# Patient Record
Sex: Male | Born: 1965 | Race: White | Hispanic: No | Smoking: Current every day smoker
Health system: Southern US, Community
[De-identification: ages and names within clinical notes are randomized; demographics above are authoritative.]

## PROBLEM LIST (undated history)

## (undated) DIAGNOSIS — F419 Anxiety disorder, unspecified: Secondary | ICD-10-CM

## (undated) DIAGNOSIS — K219 Gastro-esophageal reflux disease without esophagitis: Secondary | ICD-10-CM

## (undated) DIAGNOSIS — S22029A Unspecified fracture of second thoracic vertebra, initial encounter for closed fracture: Secondary | ICD-10-CM

## (undated) HISTORY — PX: SHOULDER ARTHROSCOPY: SHX128

## (undated) HISTORY — PX: HERNIA REPAIR: SHX51

## (undated) HISTORY — PX: BACK SURGERY: SHX140

---

## 2003-12-12 ENCOUNTER — Other Ambulatory Visit: Payer: Self-pay

## 2004-03-22 ENCOUNTER — Other Ambulatory Visit: Payer: Self-pay

## 2004-05-10 ENCOUNTER — Emergency Department: Payer: Self-pay | Admitting: Emergency Medicine

## 2004-06-30 ENCOUNTER — Emergency Department: Payer: Self-pay | Admitting: Emergency Medicine

## 2006-01-25 ENCOUNTER — Ambulatory Visit: Payer: Self-pay | Admitting: Unknown Physician Specialty

## 2006-01-26 ENCOUNTER — Emergency Department: Payer: Self-pay | Admitting: Emergency Medicine

## 2006-08-16 ENCOUNTER — Other Ambulatory Visit: Payer: Self-pay

## 2006-08-16 ENCOUNTER — Emergency Department: Payer: Self-pay | Admitting: Emergency Medicine

## 2006-08-17 ENCOUNTER — Ambulatory Visit: Payer: Self-pay | Admitting: Emergency Medicine

## 2006-08-27 ENCOUNTER — Emergency Department: Payer: Self-pay | Admitting: General Practice

## 2007-01-23 ENCOUNTER — Ambulatory Visit: Payer: Self-pay | Admitting: Anesthesiology

## 2007-03-28 ENCOUNTER — Ambulatory Visit: Payer: Self-pay | Admitting: Anesthesiology

## 2007-04-25 ENCOUNTER — Other Ambulatory Visit: Payer: Self-pay

## 2007-04-25 ENCOUNTER — Emergency Department: Payer: Self-pay | Admitting: Emergency Medicine

## 2007-04-25 ENCOUNTER — Ambulatory Visit: Payer: Self-pay | Admitting: Anesthesiology

## 2007-11-18 ENCOUNTER — Emergency Department: Payer: Self-pay | Admitting: Emergency Medicine

## 2008-05-03 ENCOUNTER — Emergency Department: Payer: Self-pay | Admitting: Internal Medicine

## 2009-04-02 ENCOUNTER — Emergency Department: Payer: Self-pay | Admitting: Emergency Medicine

## 2012-04-20 ENCOUNTER — Emergency Department: Payer: Self-pay | Admitting: Emergency Medicine

## 2014-10-17 ENCOUNTER — Emergency Department: Payer: Self-pay | Admitting: Emergency Medicine

## 2014-10-24 ENCOUNTER — Emergency Department: Payer: Self-pay | Admitting: Emergency Medicine

## 2014-10-28 ENCOUNTER — Ambulatory Visit: Payer: Self-pay | Admitting: Surgery

## 2014-10-28 DIAGNOSIS — I10 Essential (primary) hypertension: Secondary | ICD-10-CM

## 2014-10-30 ENCOUNTER — Ambulatory Visit: Payer: Self-pay | Admitting: Surgery

## 2014-10-30 LAB — DRUG SCREEN, URINE
Amphetamines, Ur Screen: NEGATIVE (ref ?–1000)
BARBITURATES, UR SCREEN: NEGATIVE (ref ?–200)
Benzodiazepine, Ur Scrn: POSITIVE (ref ?–200)
CANNABINOID 50 NG, UR ~~LOC~~: POSITIVE (ref ?–50)
COCAINE METABOLITE, UR ~~LOC~~: NEGATIVE (ref ?–300)
MDMA (Ecstasy)Ur Screen: NEGATIVE (ref ?–500)
Methadone, Ur Screen: NEGATIVE (ref ?–300)
OPIATE, UR SCREEN: NEGATIVE (ref ?–300)
PHENCYCLIDINE (PCP) UR S: NEGATIVE (ref ?–25)
TRICYCLIC, UR SCREEN: POSITIVE

## 2014-11-21 ENCOUNTER — Ambulatory Visit
Admit: 2014-11-21 | Disposition: A | Discharge status: Home / Self Care | Payer: Self-pay | Attending: Surgery | Admitting: Surgery

## 2014-12-05 ENCOUNTER — Emergency Department
Admit: 2014-12-05 | Disposition: A | Discharge status: Home / Self Care | Payer: Self-pay | Admitting: Emergency Medicine

## 2014-12-05 LAB — URINALYSIS, COMPLETE
BILIRUBIN, UR: NEGATIVE
Glucose,UR: NEGATIVE mg/dL (ref 0–75)
KETONE: NEGATIVE
Leukocyte Esterase: NEGATIVE
Nitrite: NEGATIVE
PH: 6 (ref 4.5–8.0)
Protein: NEGATIVE
Specific Gravity: 1.009 (ref 1.003–1.030)

## 2014-12-05 LAB — CBC WITH DIFFERENTIAL/PLATELET
BASOS PCT: 0.5 %
Basophil #: 0.1 10*3/uL (ref 0.0–0.1)
EOS PCT: 3 %
Eosinophil #: 0.3 10*3/uL (ref 0.0–0.7)
HCT: 43.2 % (ref 40.0–52.0)
HGB: 15.1 g/dL (ref 13.0–18.0)
LYMPHS PCT: 19.2 %
Lymphocyte #: 1.9 10*3/uL (ref 1.0–3.6)
MCH: 33.5 pg (ref 26.0–34.0)
MCHC: 35 g/dL (ref 32.0–36.0)
MCV: 96 fL (ref 80–100)
MONOS PCT: 4.6 %
Monocyte #: 0.5 x10 3/mm (ref 0.2–1.0)
NEUTROS PCT: 72.7 %
Neutrophil #: 7.3 10*3/uL — ABNORMAL HIGH (ref 1.4–6.5)
Platelet: 233 10*3/uL (ref 150–440)
RBC: 4.52 10*6/uL (ref 4.40–5.90)
RDW: 13.6 % (ref 11.5–14.5)
WBC: 10.1 10*3/uL (ref 3.8–10.6)

## 2014-12-05 LAB — COMPREHENSIVE METABOLIC PANEL
ALBUMIN: 4.4 g/dL
ALT: 17 U/L
AST: 20 U/L
Alkaline Phosphatase: 89 U/L
Anion Gap: 9 (ref 7–16)
BUN: 9 mg/dL
Bilirubin,Total: 0.1 mg/dL — ABNORMAL LOW
CALCIUM: 8.6 mg/dL — AB
Chloride: 102 mmol/L
Co2: 24 mmol/L
Creatinine: 1.2 mg/dL
EGFR (African American): >60
EGFR (Non-African Amer.): >60
Glucose: 119 mg/dL — ABNORMAL HIGH
Potassium: 3.4 mmol/L — ABNORMAL LOW
Sodium: 135 mmol/L
Total Protein: 7.5 g/dL

## 2014-12-07 NOTE — Op Note (Signed)
PATIENT NAME:  Carl Guerrero, Carl Guerrero MR#:  [id number] DATE OF BIRTH:  1966-03-28  DATE OF PROCEDURE:  10/30/2014  PREOPERATIVE DIAGNOSIS: Bilateral inguinal hernias.   POSTOPERATIVE DIAGNOSIS: Bilateral inguinal hernias.   PROCEDURE: Laparoscopic preperitoneal repair of bilateral inguinal hernias.   SURGEON: Kalika Smay E. Excell Seltzerooper, MD  ANESTHESIA: General with endotracheal tube.   INDICATIONS: This is a patient with bilateral inguinal hernias. The left is very symptomatic. Preoperatively, we discussed the rationale for surgery, the options of observation, risk of bleeding, infection, recurrence, ischemic orchitis, chronic pain syndrome, and neuroma. This was all reviewed for him in the preoperative holding area. He understood and agreed to proceed.   FINDINGS: On the right was a small indirect sac and a very weak floor. On the left was a large indirect sac and a very weak and patulous floor, as well.   DESCRIPTION OF PROCEDURE: The patient was induced under general anesthesia, given IV antibiotics. Foley catheter was placed. VTE prophylaxis was in place. He was prepped and draped in sterile fashion. Marcaine was infiltrated into skin and subcutaneous tissues around the periumbilical area. Incision was made. Dissection down to the right rectus fascia was performed. It was incised, the muscle retracted laterally. The US Surgical dissecting balloon was placed followed by the US Surgical structural balloon, and the preperitoneal space was insufflated, and under direct vision 2 midline 5 mm ports were placed. Lillyrose Reitan ligament was delineated bilaterally. The cords were skeletonized of their indirect sacs and retracted cephalad. The lateral extent of dissection was determined bilaterally and the nerve was kept in view on each side and protected.   Into the preperitoneal space, starting on the left was a 4 x 6 size laterally scissored Atrium mesh which was placed in the preperitoneal space, held in with the US  Surgical tacker, avoiding the area of the nerve. The preperitoneal space on the right side was again covered with a laterally scissored Atrium mesh as well, again held in with the US Surgical tacker, avoiding the area of the nerve. The preperitoneal space was then desufflated under direct vision. All ports were removed. The fascial edges at the periumbilical site were approximated with figure-of-eight 0 Vicryls; 4-0 subcuticular Monocryl was used on all skin edges. Steri-Strips, Mastisol and sterile dressings were placed.   The patient was taken to the recovery room in stable condition to be discharged in the care of his family. He will follow up in 10 days. There was no sign of a peritoneal rent at the time of closure under direct vision.   ____________________________ Adah Salvageichard E. Excell Seltzerooper, MD rec:ST D: 10/30/2014 15:56:37 ET T: 10/31/2014 01:48:10 ET JOB#: [id number]  cc: Adah Salvageichard E. Excell Seltzerooper, MD, <Dictator> Lattie HawICHARD E Trestin Vences MD ELECTRONICALLY SIGNED 10/31/2014 20:17

## 2014-12-19 ENCOUNTER — Emergency Department
Admission: EM | Admit: 2014-12-19 | Discharge: 2014-12-19 | Disposition: A | Discharge status: Home / Self Care | Payer: Self-pay | Attending: Emergency Medicine | Admitting: Emergency Medicine

## 2014-12-19 ENCOUNTER — Other Ambulatory Visit: Payer: Self-pay

## 2014-12-19 ENCOUNTER — Encounter: Payer: Self-pay | Admitting: Emergency Medicine

## 2014-12-19 ENCOUNTER — Emergency Department: Payer: Self-pay

## 2014-12-19 DIAGNOSIS — Z91040 Latex allergy status: Secondary | ICD-10-CM | POA: Insufficient documentation

## 2014-12-19 DIAGNOSIS — Y9289 Other specified places as the place of occurrence of the external cause: Secondary | ICD-10-CM | POA: Insufficient documentation

## 2014-12-19 DIAGNOSIS — W1809XA Striking against other object with subsequent fall, initial encounter: Secondary | ICD-10-CM | POA: Insufficient documentation

## 2014-12-19 DIAGNOSIS — R1032 Left lower quadrant pain: Secondary | ICD-10-CM | POA: Insufficient documentation

## 2014-12-19 DIAGNOSIS — S46912A Strain of unspecified muscle, fascia and tendon at shoulder and upper arm level, left arm, initial encounter: Secondary | ICD-10-CM | POA: Insufficient documentation

## 2014-12-19 DIAGNOSIS — R1031 Right lower quadrant pain: Secondary | ICD-10-CM | POA: Insufficient documentation

## 2014-12-19 DIAGNOSIS — Y998 Other external cause status: Secondary | ICD-10-CM | POA: Insufficient documentation

## 2014-12-19 DIAGNOSIS — G8929 Other chronic pain: Secondary | ICD-10-CM | POA: Insufficient documentation

## 2014-12-19 DIAGNOSIS — Y9389 Activity, other specified: Secondary | ICD-10-CM | POA: Insufficient documentation

## 2014-12-19 DIAGNOSIS — Z720 Tobacco use: Secondary | ICD-10-CM | POA: Insufficient documentation

## 2014-12-19 DIAGNOSIS — M545 Low back pain, unspecified: Secondary | ICD-10-CM

## 2014-12-19 DIAGNOSIS — M5432 Sciatica, left side: Secondary | ICD-10-CM | POA: Insufficient documentation

## 2014-12-19 DIAGNOSIS — Z9889 Other specified postprocedural states: Secondary | ICD-10-CM | POA: Insufficient documentation

## 2014-12-19 HISTORY — DX: Anxiety disorder, unspecified: F41.9

## 2014-12-19 LAB — URINALYSIS COMPLETE WITH MICROSCOPIC (ARMC ONLY)
BACTERIA UA: NONE SEEN
Bilirubin Urine: NEGATIVE
Glucose, UA: NEGATIVE mg/dL
Ketones, ur: NEGATIVE mg/dL
LEUKOCYTES UA: NEGATIVE
Nitrite: NEGATIVE
PH: 6 (ref 5.0–8.0)
PROTEIN: NEGATIVE mg/dL
Specific Gravity, Urine: 1.009 (ref 1.005–1.030)

## 2014-12-19 LAB — CBC WITH DIFFERENTIAL/PLATELET
BASOS PCT: 0 %
Basophils Absolute: 0 10*3/uL (ref 0–0.1)
EOS ABS: 0.2 10*3/uL (ref 0–0.7)
Eosinophils Relative: 2 %
HCT: 44.8 % (ref 40.0–52.0)
HEMOGLOBIN: 15.4 g/dL (ref 13.0–18.0)
LYMPHS PCT: 16 %
Lymphs Abs: 1.6 10*3/uL (ref 1.0–3.6)
MCH: 33.1 pg (ref 26.0–34.0)
MCHC: 34.5 g/dL (ref 32.0–36.0)
MCV: 96.2 fL (ref 80.0–100.0)
Monocytes Absolute: 0.5 10*3/uL (ref 0.2–1.0)
Monocytes Relative: 5 %
Neutro Abs: 7.3 10*3/uL — ABNORMAL HIGH (ref 1.4–6.5)
Neutrophils Relative %: 77 %
Platelets: 195 10*3/uL (ref 150–440)
RBC: 4.66 MIL/uL (ref 4.40–5.90)
RDW: 13.5 % (ref 11.5–14.5)
WBC: 9.6 10*3/uL (ref 3.8–10.6)

## 2014-12-19 LAB — COMPREHENSIVE METABOLIC PANEL
ALBUMIN: 4.6 g/dL (ref 3.5–5.0)
ALT: 18 U/L (ref 17–63)
AST: 23 U/L (ref 15–41)
Alkaline Phosphatase: 89 U/L (ref 38–126)
Anion gap: 10 (ref 5–15)
BUN: 5 mg/dL — ABNORMAL LOW (ref 6–20)
CHLORIDE: 102 mmol/L (ref 101–111)
CO2: 23 mmol/L (ref 22–32)
CREATININE: 1.11 mg/dL (ref 0.61–1.24)
Calcium: 8.9 mg/dL (ref 8.9–10.3)
GFR calc Af Amer: >60 mL/min (ref >60)
GFR calc non Af Amer: >60 mL/min (ref >60)
Glucose, Bld: 113 mg/dL — ABNORMAL HIGH (ref 65–99)
Potassium: 3.2 mmol/L — ABNORMAL LOW (ref 3.5–5.1)
Sodium: 135 mmol/L (ref 135–145)
Total Bilirubin: 0.5 mg/dL (ref 0.3–1.2)
Total Protein: 7.6 g/dL (ref 6.5–8.1)

## 2014-12-19 LAB — LIPASE, BLOOD: Lipase: 48 U/L (ref 22–51)

## 2014-12-19 LAB — TROPONIN I

## 2014-12-19 NOTE — ED Notes (Addendum)
Pt from home. He reports he had double hernia surgery in March. He has had abdominal pain since then. He had increased pain this morning which caused him to fall hurting his shoulder. When the pain gets bad it radiates to his testicles.

## 2014-12-19 NOTE — Discharge Instructions (Signed)
As we discussed, you have multiple sources of chronic pain for which she needs to follow up with outpatient doctors.  Please return to [facility] (Dr. Excell Seltzerooper) to see a surgeon about your pain after your hernia operation.  We also recommended to see an orthopedic surgeon such as Dr. Hyacinth MeekerMiller to discuss your left shoulder pain and your chronic lower back pain with sciatica.  Other than shoulder strain today after fall, it is fortunate that you do not have any other acute injuries at this time.  Please take over-the-counter pain medication and follow up with her regular doctors.  In the emergency department, we cannot provide narcotic pain medication for chronic conditions as per the [facility] Chronic pain policy (see below).   Emergency care providers appreciate that many patients coming to us are in severe pain and we wish to address their pain in the safest, most responsible manner.  It is important to recognize, however, that the proper treatment of chronic pain differs from that of the pain of injuries and acute illnesses.  Our goal is to provider quality, safe, personalized care and we thank you for giving us the opportunity to serve you.  The use of narcotics and related agents for chronic pain syndromes may lead to additional physical and psychological problems.  Nearly as many people die from prescription narcotics each year as die from car crashes.  Additionally, this risk is increased if such prescriptions are obtained from a variety of sources.  Therefore, only your primary care physician or a pain management specialist is able to safely treat such syndromes with narcotic medications long-term.  Documentation revealing such prescriptions have been sought from multiple sources may prohibit us from providing a refill or different narcotic medication.  Your name may be checked first through the [facility] Controlled Substances Reporting System.  This database is a record of controlled substance  medication prescriptions that the patient has received.  This has been established by [facility] in an effort to eliminate the dangerous, and often life-threatening, practice of obtaining multiple prescriptions from different medical providers.  If you have a chronic pain syndrome (i.e. chronic headaches, recurrent back or neck pain, dental pain, abdominal or pelvic pain without a specific diagnosis, or neuropathic pain such as fibromyalgia) or recurrent visits for the same condition without an acute diagnosis, you may be treated with non-narcotics and other non-addictive medicines.  Allergic reactions or negative side effects that may be reported by a patient to such medications will not typically lead to the use of a narcotic analgesic or other controlled substance as an alternative.  Patients managing chronic pain with a personal physician should have provisions in place for breakthrough pain.  If you are in crisis, you should call your physician.  If your physician directs you to the emergency department, please have the doctor call and speak to our attending physician concerning your care.  When patients come to the Emergency Department (ED) with acute medical conditions in which the ED physician feels it is appropriate to prescribe narcotic or sedating pain medication, the physician will prescribe these in very limited quantities.  The amount of these medications will last only until you can see your primary care physician in his/her office.  Any patient who returns to the ED seeking refills should expect only non-narcotic pain medications.  In the event an acute medical condition exists and the emergency physician feels it is necessary that the patient be given a narcotic or sedating medication, a  responsible adult driver should be present in the room prior to the medication being given by the nurse.  Prescriptions for narcotic or sedating medications that have been lost, stolen, or expired will  NOT be refilled in the ED.  Patients who have chronic pain may receive non-narcotic prescriptions until seen by their primary care physician.  It is every patient's personal responsibility to maintain active prescriptions with his or her primary care physician or specialist.

## 2014-12-19 NOTE — ED Notes (Addendum)
Patient c/o RLQ pain since his double inguinal hernia repair in March. Patient says he has had CT scans without a definitive diagnosis. Patient also c/o "back problems" with pain shooting down both his legs (worse on left). Patient fell this AM; unsure of cause of fall; fell on his left shoulder which has had several operations previously.

## 2014-12-19 NOTE — ED Provider Notes (Signed)
[facility] Emergency Department Provider Note  ____________________________________________  Time seen: Approximately 4:02 PM  I have reviewed the triage vital signs and the nursing notes.   HISTORY  Chief Complaint Abdominal Pain; Shoulder Pain; and Sciatica    HPI Carl Guerrero is a 49 y.o. male with chronic lower abdominal pain after a hernia surgery about 2 months ago and chronic back pain with sciatica.  He has followed up both in the emergency department and in the surgery clinic and had repeat CT scans of his abdomen and pelvis which have been unremarkable.  He states that the pain hits him intermittently and is severe in his right groin.  Today it hit him suddenly and caused him to fall, landing on his left shoulder which has some chronic pain from a prior surgery.  He is in no distress at this time.  Moving and walking make it worse, nothing makes it better.   Past Medical History  Diagnosis Date  . Anxiety     There are no active problems to display for this patient.   Past Surgical History  Procedure Laterality Date  . Shoulder arthroscopy    . Hernia repair    . Back surgery      No current outpatient prescriptions on file.  Allergies Ivp dye; Versed; Phenergan; and Latex  No family history on file.  Social History History  Substance Use Topics  . Smoking status: Current Every Day Smoker -- 1.00 packs/day    Types: Cigarettes  . Smokeless tobacco: Not on file  . Alcohol Use: Yes     Comment: rarely    Review of Systems Constitutional: No fever/chills Eyes: No visual changes. ENT: No sore throat. Cardiovascular: Denies chest pain. Respiratory: Denies shortness of breath. Gastrointestinal: Lower right groin pain.  No nausea, no vomiting.  No diarrhea.  No constipation. Genitourinary: Negative for dysuria. Musculoskeletal: Chronic lower back pain radiating down both legs, primarily the left Skin: Negative for  rash. Neurological: Negative for headaches, focal weakness or numbness.  10-point ROS otherwise negative.  ____________________________________________   PHYSICAL EXAM:  VITAL SIGNS: ED Triage Vitals  Enc Vitals Group     BP 12/19/14 1350 159/90 mmHg     Pulse Rate 12/19/14 1350 112     Resp 12/19/14 1350 16     Temp 12/19/14 1350 98 F (36.7 C)     Temp Source 12/19/14 1350 Oral     SpO2 12/19/14 1350 98 %     Weight 12/19/14 1350 192 lb (87.091 kg)     Height 12/19/14 1350  (1.778 m)     Head Cir --      Peak Flow --      Pain Score 12/19/14 1352 9     Pain Loc --      Pain Edu? --      Excl. in GC? --     Constitutional: Alert and oriented. Well appearing and in no acute distress. Eyes: Conjunctivae are normal. PERRL. EOMI. Head: Atraumatic. Nose: No congestion/rhinnorhea. Mouth/Throat: Mucous membranes are moist.  Oropharynx non-erythematous. Neck: No stridor.  No cervical spine tenderness to palpation. Cardiovascular: Normal rate, regular rhythm. Grossly normal heart sounds.  Good peripheral circulation. Respiratory: Normal respiratory effort.  No retractions. Lungs CTAB. Gastrointestinal: Soft and nontender. No distention. No abdominal bruits. No CVA tenderness. Genitourinary: Normal-appearing circumcised male.  No tenderness of the testes.  No palpable mass.  No evidence of inguinal hernia on exam. Musculoskeletal: No lower extremity  tenderness nor edema.  No joint effusions.  Tenderness with range of motion of left shoulder but range of motion is not limited. Neurologic:  Normal speech and language. No gross focal neurologic deficits are appreciated. Speech is normal. No gait instability. Skin:  Skin is warm, dry and intact. No rash noted. Psychiatric: Mood and affect are normal. Speech and behavior are normal.  ____________________________________________   LABS (all labs ordered are listed, but only abnormal results are displayed)  Labs Reviewed   COMPREHENSIVE METABOLIC PANEL - Abnormal; Notable for the following:    Potassium 3.2 (*)    Glucose, Bld 113 (*)    BUN 5 (*)    All other components within normal limits  CBC WITH DIFFERENTIAL/PLATELET - Abnormal; Notable for the following:    Neutro Abs 7.3 (*)    All other components within normal limits  URINALYSIS COMPLETEWITH MICROSCOPIC ([facility])  - Abnormal; Notable for the following:    Color, Urine YELLOW (*)    APPearance CLEAR (*)    Hgb urine dipstick 1+ (*)    Squamous Epithelial / LPF 0-5 (*)    All other components within normal limits  LIPASE, BLOOD  TROPONIN I   ____________________________________________  EKG  ED ECG REPORT   Date: 12/19/2014  EKG Time: 14:03  Rate: 90  Rhythm: normal sinus rhythm with sinus arrhythmia  Axis: Normal  Intervals:none  ST&T Change: None  ____________________________________________  RADIOLOGY  Dg Shoulder Left  12/19/2014   CLINICAL DATA:  Larey SeatFell on left shoulder this morning. Previous surgery 20 years ago. Posterior and lateral pain.  EXAM: LEFT SHOULDER - 2+ VIEW  COMPARISON:  None.  FINDINGS: No evidence of fracture, dislocation or degenerative change. Previous screw anchors noted in the anterior glenoid. Normal humeral acromial distance.  IMPRESSION: No acute or traumatic finding.  Old glenoid anchors.   Electronically Signed   By: Paulina FusiMark  Shogry M.D.   On: 12/19/2014 14:37    Report copied from [facility] (but not imported into [facility] during migration): REASON FOR EXAM:    post op pelvic pain COMMENTS:     PROCEDURE: CT  - CT ABDOMEN / PELVIS  W  - Nov 21 2014 10:35AM   CLINICAL DATA:  Pelvic pain, right greater than left. 3 weeks status post repair of bilateral inguinal hernias.  EXAM: CT ABDOMEN AND PELVIS WITH CONTRAST  TECHNIQUE: Multidetector CT imaging of the abdomen and pelvis was performed using the standard protocol following bolus administration of intravenous contrast. CONTRAST:  100 mL Omnipaque 300  IV  COMPARISON:  None.  FINDINGS: Hernia mesh is identified in the pelvis without evidence of recurrent inguinal hernias. No ventral hernias are identified. No focal abscess or free fluid.  The liver, gallbladder, pancreas, spleen, adrenal glands, kidneys and bowel are within normal limits. No free air. No vascular abnormalities are identified. No incidental mass lesions or enlarged lymph nodes are seen. The bladder is unremarkable. Mild degenerative disc disease present at L4-5 and L5-S1.  IMPRESSION: Unremarkable CT of the abdomen and pelvis. No evidence of recurrent hernia is after recent hernia repair. No acute findings.  Electronically Signed   By: Irish LackGlenn  Yamagata M.D.   On: 11/21/2014 14:33   Verified By: Reola CalkinsGLENN T. YAMAGATA, M.D.,  ___________________________________________   PROCEDURES  Procedure(s) performed: None  Critical Care performed: No  ____________________________________________   INITIAL IMPRESSION / ASSESSMENT AND PLAN / ED COURSE  Pertinent labs & imaging results that were available during my care of the patient were reviewed  by me and considered in my medical decision making (see chart for details).  The patient has had multiple emergency department visits and trips to the surgery clinic in the last 6 weeks.  As per the [facility] controlled substance database, he has received 278 tablets of Percocet in that time period from at least 5 different providers (see the media tab on chart review for a scanned copy of the report).  I am concerned about chronic opioid abuse, particularly given his reassuring physical exam and radiographic imaging.  I provided the patient with follow-up information for his chronic sciatica and chronic abdominal pain and encouraged him to see his outpatient providers.  I explained that the emergency department cannot provide narcotics for his chronic pain.  He understands and states he will follow-up as an  outpatient. ____________________________________________   FINAL CLINICAL IMPRESSION(S) / ED DIAGNOSES  Final diagnoses:  Chronic bilateral lower abdominal pain  Chronic lower back pain  Chronic sciatica of left side  Left shoulder strain, initial encounter     Loleta Roseory Tzirel Leonor, MD 12/19/14 2350

## 2015-04-07 ENCOUNTER — Encounter: Payer: Self-pay | Admitting: Emergency Medicine

## 2015-04-07 ENCOUNTER — Emergency Department
Admission: EM | Admit: 2015-04-07 | Discharge: 2015-04-08 | Disposition: A | Discharge status: Home / Self Care | Payer: Self-pay | Attending: Emergency Medicine | Admitting: Emergency Medicine

## 2015-04-07 DIAGNOSIS — Z720 Tobacco use: Secondary | ICD-10-CM | POA: Insufficient documentation

## 2015-04-07 DIAGNOSIS — Y9389 Activity, other specified: Secondary | ICD-10-CM | POA: Insufficient documentation

## 2015-04-07 DIAGNOSIS — X58XXXA Exposure to other specified factors, initial encounter: Secondary | ICD-10-CM | POA: Insufficient documentation

## 2015-04-07 DIAGNOSIS — M791 Myalgia, unspecified site: Secondary | ICD-10-CM

## 2015-04-07 DIAGNOSIS — S39012A Strain of muscle, fascia and tendon of lower back, initial encounter: Secondary | ICD-10-CM | POA: Insufficient documentation

## 2015-04-07 DIAGNOSIS — Y9289 Other specified places as the place of occurrence of the external cause: Secondary | ICD-10-CM | POA: Insufficient documentation

## 2015-04-07 DIAGNOSIS — Z91040 Latex allergy status: Secondary | ICD-10-CM | POA: Insufficient documentation

## 2015-04-07 DIAGNOSIS — Y998 Other external cause status: Secondary | ICD-10-CM | POA: Insufficient documentation

## 2015-04-07 MED ORDER — ORPHENADRINE CITRATE 30 MG/ML IJ SOLN
60.0000 mg | INTRAMUSCULAR | Status: AC
  Administered 2015-04-07: 60 mg via INTRAMUSCULAR
  Filled 2015-04-07: qty 2

## 2015-04-07 MED ORDER — KETOROLAC TROMETHAMINE 60 MG/2ML IM SOLN
60.0000 mg | Freq: Once | INTRAMUSCULAR | Status: AC
  Administered 2015-04-07: 60 mg via INTRAMUSCULAR
  Filled 2015-04-07: qty 2

## 2015-04-07 NOTE — ED Notes (Signed)
Patient present to ED with c/o back pain since 7:30 this morning, states rose from his recliner and felt shooting pain in lower back. Reports pain is from neck radiating down to lower back, states "when I stand up I can't stand straight." Denies known injury to back. History of 3 back surgeries, had 4 disc removed and had emergency section for infection in his back. Reports "pain has never been this bad." States has tried ice back, goodies, warm shower with no relief. Patient denies other complaints at this time. Patient alert and oriented x 4, no increased work in breathing noted.

## 2015-04-07 NOTE — ED Provider Notes (Signed)
[facility] Emergency Department Provider Note ____________________________________________  Time seen: 2255 I have reviewed the triage vital signs and the nursing notes.  HISTORY  Chief Complaint  Back Pain  HPI Carl Guerrero is a 49 y.o. male reports to the ED with complaints of muscle spasm upon awakening this morning. He reports that onset was about 7:30 after he been up for several hours. When he has some limited increased spasm across the lower part of his back. He denies any referral to lotion any, denies any leg weakness or foot drop. He also is without any incontinence of bladder or bowel. He took a dose of Cipro. Benefits of his symptoms. He describes pain is worse when he tries to stand up straight. He is here for treatment of his symptoms without known injury, trauma, or accident.  Past Medical History  Diagnosis Date  . Anxiety    There are no active problems to display for this patient.   Past Surgical History  Procedure Laterality Date  . Shoulder arthroscopy    . Hernia repair    . Back surgery     Current Outpatient Rx  Name  Route  Sig  Dispense  Refill  . cyclobenzaprine (FLEXERIL) 5 MG tablet   Oral   Take 1 tablet (5 mg total) by mouth every 8 (eight) hours as needed for muscle spasms.   12 tablet   0   . ketorolac (TORADOL) 10 MG tablet   Oral   Take 1 tablet (10 mg total) by mouth every 8 (eight) hours.   15 tablet   0   . traMADol (ULTRAM) 50 MG tablet   Oral   Take 1 tablet (50 mg total) by mouth 2 (two) times daily.   10 tablet   0    Allergies Ivp dye; Versed; Phenergan; and Latex  No family history on file.  Social History Social History  Substance Use Topics  . Smoking status: Current Every Day Smoker -- 1.00 packs/day    Types: Cigarettes  . Smokeless tobacco: None  . Alcohol Use: Yes     Comment: rarely   Review of Systems  Constitutional: Negative for fever. Eyes: Negative for visual  changes. ENT: Negative for sore throat. Cardiovascular: Negative for chest pain. Respiratory: Negative for shortness of breath. Gastrointestinal: Negative for abdominal pain, vomiting and diarrhea. Genitourinary: Negative for dysuria. Musculoskeletal: positive for back pain. Skin: Negative for rash. Neurological: Negative for headaches, focal weakness or numbness. ____________________________________________  PHYSICAL EXAM:  VITAL SIGNS: ED Triage Vitals  Enc Vitals Group     BP 04/07/15 2133 159/89 mmHg     Pulse Rate 04/07/15 2133 87     Resp 04/07/15 2133 20     Temp 04/07/15 2133 98.1 F (36.7 C)     Temp Source 04/07/15 2133 Oral     SpO2 04/07/15 2133 99 %     Weight 04/07/15 2133 195 lb (88.451 kg)     Height 04/07/15 2133 5\' 11"  (1.803 m)     Head Cir --      Peak Flow --      Pain Score 04/07/15 2133 9     Pain Loc --      Pain Edu? --      Excl. in GC? --    Constitutional: Alert and oriented. Well appearing and in no distress. Eyes: Conjunctivae are normal. PERRL. Normal extraocular movements. ENT   Head: Normocephalic and atraumatic.   Nose: No congestion/rhinnorhea.  Mouth/Throat: Mucous membranes are moist.   Neck: Supple. No thyromegaly. Hematological/Lymphatic/Immunilogical: No cervical lymphadenopathy. Cardiovascular: Normal rate, regular rhythm.  Respiratory: Normal respiratory effort. No wheezes/rales/rhonchi. Gastrointestinal: Soft and nontender. No distention. Musculoskeletal: Normal spinal alignment without midline tenderness, deformity, or step-off. Normal lumbar flexion, but decreased extension. Negative SLR bilaterally. Normal toe/heel raise. Nontender with normal range of motion in all extremities.  Neurologic: CN II-XII grossly intact. Normal LE DTRs bilaterally. Normal gait without ataxia. Normal speech and language. No gross focal neurologic deficits are appreciated. Skin:  Skin is warm, dry and intact. No rash  noted. Psychiatric: Mood and affect are normal. Patient exhibits appropriate insight and judgment. ____________________________________________    LABS (pertinent positives/negatives) Labs Reviewed  URINALYSIS COMPLETEWITH MICROSCOPIC ([facility] ONLY) - Abnormal; Notable for the following:    Color, Urine COLORLESS (*)    APPearance CLEAR (*)    Specific Gravity, Urine 1.002 (*)    Hgb urine dipstick 1+ (*)    Bacteria, UA RARE (*)    Squamous Epithelial / LPF 0-5 (*)    All other components within normal limits  ____________________________________________  PROCEDURES  Toradol 60 mg IM Norflex 60 mg IM ____________________________________________  INITIAL IMPRESSION / ASSESSMENT AND PLAN / ED COURSE  Lumbar strain and myospasms. Exam without evidence of neuromuscular deficit. Prescriptions for Toradol, Flexeril, and Ultram provided.  Follow-up with Dr. Carlynn Purl as needeed. ____________________________________________  FINAL CLINICAL IMPRESSION(S) / ED DIAGNOSES  Final diagnoses:  Lumbar strain, initial encounter  Myalgia     Lissa Hoard, PA-C 04/08/15 0030  Myrna Blazer, MD 04/11/15 [phone]

## 2015-04-07 NOTE — ED Notes (Signed)
Pt to triage via w/c with no distress noted; pt reports hx back injury and having lower back spasms today radiating into leg

## 2015-04-08 LAB — URINALYSIS COMPLETE WITH MICROSCOPIC (ARMC ONLY)
Bilirubin Urine: NEGATIVE
GLUCOSE, UA: NEGATIVE mg/dL
Ketones, ur: NEGATIVE mg/dL
Leukocytes, UA: NEGATIVE
Nitrite: NEGATIVE
Protein, ur: NEGATIVE mg/dL
Specific Gravity, Urine: 1.002 — ABNORMAL LOW (ref 1.005–1.030)
pH: 6 (ref 5.0–8.0)

## 2015-04-08 MED ORDER — CYCLOBENZAPRINE HCL 5 MG PO TABS: 5.0000 mg | ORAL_TABLET | Freq: Three times a day (TID) | ORAL | Status: DC | PRN

## 2015-04-08 MED ORDER — KETOROLAC TROMETHAMINE 10 MG PO TABS
10.0000 mg | ORAL_TABLET | Freq: Three times a day (TID) | ORAL | Status: DC
Start: 2015-04-08 — End: 2015-11-02

## 2015-04-08 MED ORDER — TRAMADOL HCL 50 MG PO TABS: 50.0000 mg | ORAL_TABLET | Freq: Two times a day (BID) | ORAL | Status: DC

## 2015-04-08 NOTE — ED Notes (Signed)

## 2015-04-08 NOTE — Discharge Instructions (Signed)
Back Pain, Adult °Low back pain is very common. About 1 in 5 people have back pain. The cause of low back pain is rarely dangerous. The pain often gets better over time. About half of people with a sudden onset of back pain feel better in just 2 weeks. About 8 in 10 people feel better by 6 weeks.  °CAUSES °Some common causes of back pain include: °· Strain of the muscles or ligaments supporting the spine. °· Wear and tear (degeneration) of the spinal discs. °· Arthritis. °· Direct injury to the back. °DIAGNOSIS °Most of the time, the direct cause of low back pain is not known. However, back pain can be treated effectively even when the exact cause of the pain is unknown. Answering your caregiver's questions about your overall health and symptoms is one of the most accurate ways to make sure the cause of your pain is not dangerous. If your caregiver needs more information, he or she may order lab work or imaging tests (X-rays or MRIs). However, even if imaging tests show changes in your back, this usually does not require surgery. °HOME CARE INSTRUCTIONS °For many people, back pain returns. Since low back pain is rarely dangerous, it is often a condition that people can learn to manage on their own.  °· Remain active. It is stressful on the back to sit or stand in one place. Do not sit, drive, or stand in one place for more than 30 minutes at a time. Take short walks on level surfaces as soon as pain allows. Try to increase the length of time you walk each day. °· Do not stay in bed. Resting more than 1 or 2 days can delay your recovery. °· Do not avoid exercise or work. Your body is made to move. It is not dangerous to be active, even though your back may hurt. Your back will likely heal faster if you return to being active before your pain is gone. °· Pay attention to your body when you  bend and lift. Many people have less discomfort when lifting if they bend their knees, keep the load close to their bodies, and  avoid twisting. Often, the most comfortable positions are those that put less stress on your recovering back. °· Find a comfortable position to sleep. Use a firm mattress and lie on your side with your knees slightly bent. If you lie on your back, put a pillow under your knees. °· Only take over-the-counter or prescription medicines as directed by your caregiver. Over-the-counter medicines to reduce pain and inflammation are often the most helpful. Your caregiver may prescribe muscle relaxant drugs. These medicines help dull your pain so you can more quickly return to your normal activities and healthy exercise. °· Put ice on the injured area. °¨ Put ice in a plastic bag. °¨ Place a towel between your skin and the bag. °¨ Leave the ice on for 15-20 minutes, 03-04 times a day for the first 2 to 3 days. After that, ice and heat may be alternated to reduce pain and spasms. °· Ask your caregiver about trying back exercises and gentle massage. This may be of some benefit. °· Avoid feeling anxious or stressed. Stress increases muscle tension and can worsen back pain. It is important to recognize when you are anxious or stressed and learn ways to manage it. Exercise is a great option. °SEEK MEDICAL CARE IF: °· You have pain that is not relieved with rest or medicine. °· You have pain that does not improve in 1 week. °· You have new symptoms. °· You are generally not feeling well. °SEEK   IMMEDIATE MEDICAL CARE IF:   You have pain that radiates from your back into your legs.  You develop new bowel or bladder control problems.  You have unusual weakness or numbness in your arms or legs.  You develop nausea or vomiting.  You develop abdominal pain.  You feel faint. Document Released: 07/25/2005 Document Revised: 01/24/2012 Document Reviewed: 11/26/2013 [facility] Patient Information 2015 [place], [place]. This information is not intended to replace advice given to you by your health care provider. Make sure you  discuss any questions you have with your health care provider.  Take the prescription meds as directed. Follow-up with Dr. Carlynn Purl for ongoing symptoms.

## 2015-06-09 DEATH — deceased

## 2015-11-02 ENCOUNTER — Encounter: Payer: Self-pay | Admitting: Emergency Medicine

## 2015-11-02 ENCOUNTER — Emergency Department: Payer: Self-pay

## 2015-11-02 ENCOUNTER — Emergency Department
Admission: EM | Admit: 2015-11-02 | Discharge: 2015-11-02 | Disposition: A | Discharge status: Home / Self Care | Payer: Self-pay | Attending: Emergency Medicine | Admitting: Emergency Medicine

## 2015-11-02 DIAGNOSIS — Y939 Activity, unspecified: Secondary | ICD-10-CM | POA: Insufficient documentation

## 2015-11-02 DIAGNOSIS — F17210 Nicotine dependence, cigarettes, uncomplicated: Secondary | ICD-10-CM | POA: Insufficient documentation

## 2015-11-02 DIAGNOSIS — F1210 Cannabis abuse, uncomplicated: Secondary | ICD-10-CM | POA: Insufficient documentation

## 2015-11-02 DIAGNOSIS — Y929 Unspecified place or not applicable: Secondary | ICD-10-CM | POA: Insufficient documentation

## 2015-11-02 DIAGNOSIS — Y999 Unspecified external cause status: Secondary | ICD-10-CM | POA: Insufficient documentation

## 2015-11-02 DIAGNOSIS — S0083XA Contusion of other part of head, initial encounter: Secondary | ICD-10-CM | POA: Insufficient documentation

## 2015-11-02 DIAGNOSIS — F419 Anxiety disorder, unspecified: Secondary | ICD-10-CM | POA: Insufficient documentation

## 2015-11-02 DIAGNOSIS — S20211A Contusion of right front wall of thorax, initial encounter: Secondary | ICD-10-CM | POA: Insufficient documentation

## 2015-11-02 MED ORDER — IBUPROFEN 800 MG PO TABS: 800.0000 mg | ORAL_TABLET | Freq: Three times a day (TID) | ORAL | Status: AC | PRN

## 2015-11-02 MED ORDER — METHOCARBAMOL 500 MG PO TABS: 500.0000 mg | ORAL_TABLET | Freq: Four times a day (QID) | ORAL | Status: AC | PRN

## 2015-11-02 MED ORDER — TRAMADOL HCL 50 MG PO TABS: 50.0000 mg | ORAL_TABLET | Freq: Four times a day (QID) | ORAL | Status: AC | PRN
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
## 2015-11-02 NOTE — Discharge Instructions (Signed)
Chest Contusion A contusion is a deep bruise. Bruises happen when an injury causes bleeding under the skin. Signs of bruising include pain, puffiness (swelling), and discolored skin. The bruise may turn blue, purple, or yellow.  HOME CARE  Put ice on the injured area.  Put ice in a plastic bag.  Place a towel between the skin and the bag.  Leave the ice on for 15-20 minutes at a time, 03-04 times a day for the first 48 hours.  Only take medicine as told by your doctor.  Rest.  Take deep breaths (deep-breathing exercises) as told by your doctor.  Stop smoking if you smoke.  Do not lift objects over 5 pounds (2.3 kilograms) for 3 days or longer if told by your doctor. GET HELP RIGHT AWAY IF:   You have more bruising or puffiness.  You have pain that gets worse.  You have trouble breathing.  You are dizzy, weak, or pass out (faint).  You have blood in your pee (urine) or poop (stool).  You cough up or throw up (vomit) blood.  Your puffiness or pain is not helped with medicines. MAKE SURE YOU:   Understand these instructions.  Will watch your condition.  Will get help right away if you are not doing well or get worse.   This information is not intended to replace advice given to you by your health care provider. Make sure you discuss any questions you have with your health care provider.   Document Released: 01/11/2008 Document Revised: 04/18/2012 Document Reviewed: 01/16/2012 Elsevier Interactive Patient Education 2016 [employer]  Facial or Scalp Contusion A facial or scalp contusion is a deep bruise on the face or head. Injuries to the face and head generally cause a lot of swelling, especially around the eyes. Contusions are the result of an injury that caused bleeding under the skin. The contusion may turn blue, purple, or yellow. Minor injuries will give you a painless contusion, but more severe contusions may stay painful and swollen for a few weeks.  CAUSES    A facial or scalp contusion is caused by a blunt injury or trauma to the face or head area.  SIGNS AND SYMPTOMS   Swelling of the injured area.   Discoloration of the injured area.   Tenderness, soreness, or pain in the injured area.  DIAGNOSIS  The diagnosis can be made by taking a medical history and doing a physical exam. An X-ray exam, CT scan, or MRI may be needed to determine if there are any associated injuries, such as broken bones (fractures). TREATMENT  Often, the best treatment for a facial or scalp contusion is applying cold compresses to the injured area. Over-the-counter medicines may also be recommended for pain control.  HOME CARE INSTRUCTIONS   Only take over-the-counter or prescription medicines as directed by your health care provider.   Apply ice to the injured area.   Put ice in a plastic bag.   Place a towel between your skin and the bag.   Leave the ice on for 20 minutes, 2-3 times a day.  SEEK MEDICAL CARE IF:  You have bite problems.   You have pain with chewing.   You are concerned about facial defects. SEEK IMMEDIATE MEDICAL CARE IF:  You have severe pain or a headache that is not relieved by medicine.   You have unusual sleepiness, confusion, or personality changes.   You throw up (vomit).   You have a persistent nosebleed.   You  have double vision or blurred vision.   You have fluid drainage from your nose or ear.   You have difficulty walking or using your arms or legs.  MAKE SURE YOU:   Understand these instructions.  Will watch your condition.  Will get help right away if you are not doing well or get worse.   This information is not intended to replace advice given to you by your health care provider. Make sure you discuss any questions you have with your health care provider.   Document Released: 09/01/2004 Document Revised: 08/15/2014 Document Reviewed: 03/07/2013 Elsevier Interactive Patient Education 2016  [employer]  Rib Contusion A rib contusion is a deep bruise on your rib area. Contusions are the result of a blunt trauma that causes bleeding and injury to the tissues under the skin. A rib contusion may involve bruising of the ribs and of the skin and muscles in the area. The skin overlying the contusion may turn blue, purple, or yellow. Minor injuries will give you a painless contusion, but more severe contusions may stay painful and swollen for a few weeks. CAUSES  A contusion is usually caused by a blow, trauma, or direct force to an area of the body. This often occurs while playing contact sports. SYMPTOMS  Swelling and redness of the injured area.  Discoloration of the injured area.  Tenderness and soreness of the injured area.  Pain with or without movement. DIAGNOSIS  The diagnosis can be made by taking a medical history and performing a physical exam. An X-ray, CT scan, or MRI may be needed to determine if there were any associated injuries, such as broken bones (fractures) or internal injuries. TREATMENT  Often, the best treatment for a rib contusion is rest. Icing or applying cold compresses to the injured area may help reduce swelling and inflammation. Deep breathing exercises may be recommended to reduce the risk of partial lung collapse and pneumonia. Over-the-counter or prescription medicines may also be recommended for pain control. HOME CARE INSTRUCTIONS   Apply ice to the injured area:  Put ice in a plastic bag.  Place a towel between your skin and the bag.  Leave the ice on for 20 minutes, 2-3 times per day.  Take medicines only as directed by your health care provider.  Rest the injured area. Avoid strenuous activity and any activities or movements that cause pain. Be careful during activities and avoid bumping the injured area.  Perform deep-breathing exercises as directed by your health care provider.  Do not lift anything that is heavier than 5 lb (2.3 kg)  until your health care provider approves.  Do not use any tobacco products, including cigarettes, chewing tobacco, or electronic cigarettes. If you need help quitting, ask your health care provider. SEEK MEDICAL CARE IF:   You have increased bruising or swelling.  You have pain that is not controlled with treatment.  You have a fever. SEEK IMMEDIATE MEDICAL CARE IF:   You have difficulty breathing or shortness of breath.  You develop a continual cough, or you cough up thick or bloody sputum.  You feel sick to your stomach (nauseous), you throw up (vomit), or you have abdominal pain.   This information is not intended to replace advice given to you by your health care provider. Make sure you discuss any questions you have with your health care provider.   Document Released: 04/19/2001 Document Revised: 08/15/2014 Document Reviewed: 05/06/2014 Elsevier Interactive Patient Education 2016 [employer] Inc.  Jaw Contusion  A jaw contusion is a deep bruise of the jaw. Contusions are the result of an injury to muscles and tissue under the skin, which causes bleeding under the skin. The contusion may turn blue, purple, or yellow. Minor injuries will cause a painless contusion, but more severe contusions may stay painful and swollen for a few weeks. CAUSES This condition is usually caused by trauma, direct force, or a hard hit (blow) to the jaw. SYMPTOMS Symptoms of this condition include:  Jaw pain.  Jaw swelling.  Jaw bruising, redness, or discoloration.  Jaw tenderness or soreness. DIAGNOSIS This condition is diagnosed with a medical history and a physical exam. An X-ray, CT scan, or MRI may be needed to determine if there were any associated injuries, such as broken bones (fractures). TREATMENT Often, the best treatment for this condition is applying cold compresses to the injured area and eating a soft diet. Over-the-counter medicines may also be recommended for pain control. HOME  CARE INSTRUCTIONS Diet  Eat soft foods as told by your health care provider. Soft foods include baby food, gelatin, oatmeal, ice cream, applesauce, bananas, eggs, pasta, cottage cheese, soups, and yogurt.  Cut food into smaller pieces. This makes it easier to chew.  Avoid chewing gum or ice. General Instructions  If directed, apply ice to the injured area:  Put ice in a plastic bag.  Place a towel between your skin and the bag.  Leave the ice on for 20 minutes, 2-3 times per day.  Take over-the-counter and prescription medicines only as told by your health care provider.  Avoid opening your mouth widely. This includes opening your mouth to eat large pieces of food or to yawn, scream, yell, or sing.  Keep all follow-up visits as told by your health care provider. This is important. SEEK MEDICAL CARE IF:  Your pain is not controlled with medicine.  Your symptoms do not improve with treatment or they get worse.  You have new symptoms. SEEK IMMEDIATE MEDICAL CARE IF:  You have any new cracking or clicking in your jaw.  You have trouble eating or you cannot eat.   This information is not intended to replace advice given to you by your health care provider. Make sure you discuss any questions you have with your health care provider.   Document Released: 10/15/2003 Document Revised: 04/15/2015 Document Reviewed: 10/20/2014 Elsevier Interactive Patient Education [employer] Inc[employer]

## 2016-08-04 IMAGING — CT CT ABD-PELV W/ CM
1 of 4 series · 12 of 32 positions shown, 18 images · IV contrast (omnipaque)
Comparison: None.

CLINICAL DATA: Pelvic pain, right greater than left. 3 weeks status
post repair of bilateral inguinal hernias.

EXAM:
CT ABDOMEN AND PELVIS WITH CONTRAST
TECHNIQUE: Multidetector CT imaging of the abdomen and pelvis was performed
using the standard protocol following bolus administration of
intravenous contrast.
CONTRAST:  100 mL Omnipaque 300 IV

[Series 2: routine abd pel with · axial · 0.75mm/px · z∈[-1264,-849]mm · 12 of 99 slices shown, 18 images]
[im 8/99  soft-tissue]
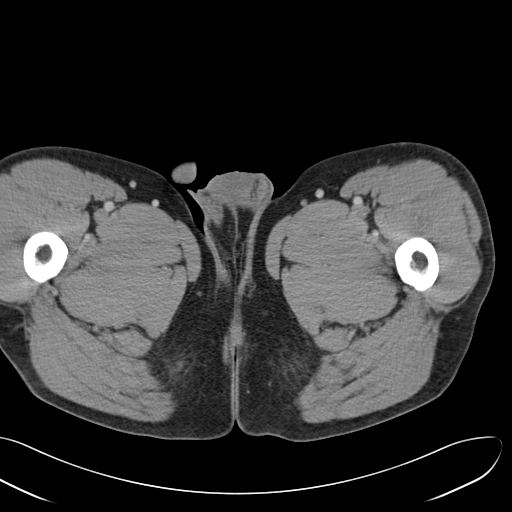
[im 8/99  bone]
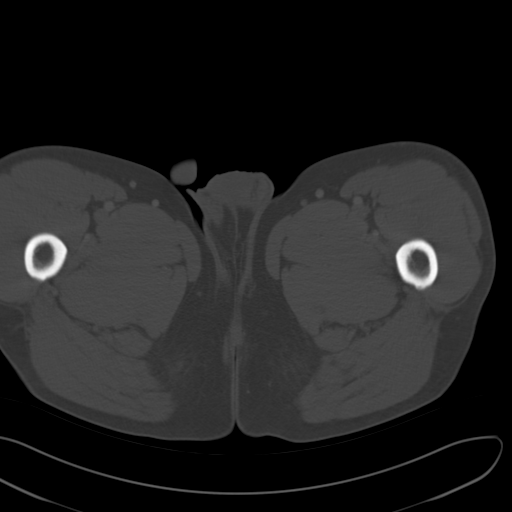
[im 16/99  soft-tissue]
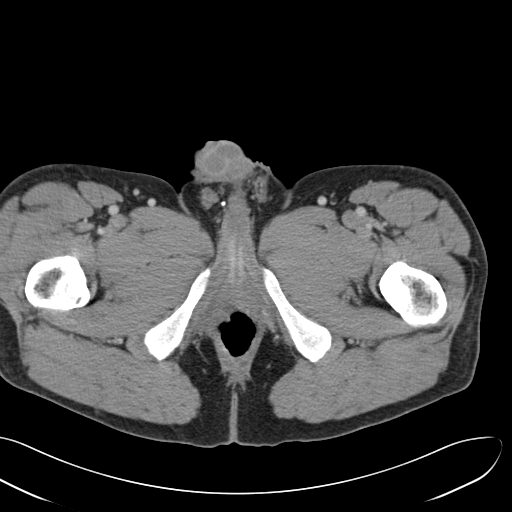
[im 23/99  soft-tissue]
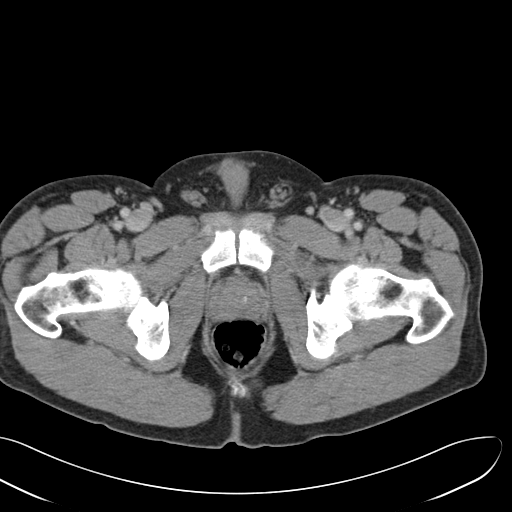
[im 31/99  soft-tissue]
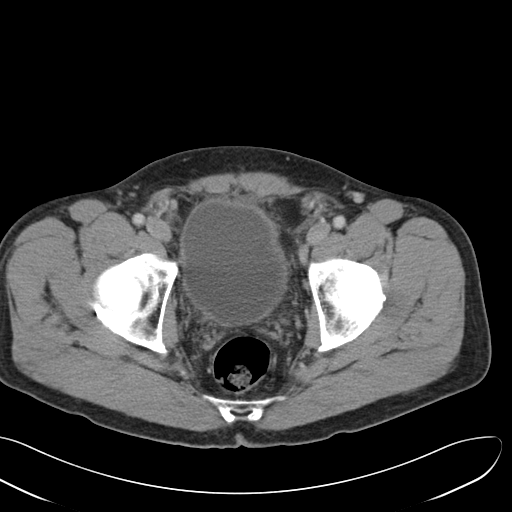
[im 38/99  soft-tissue]
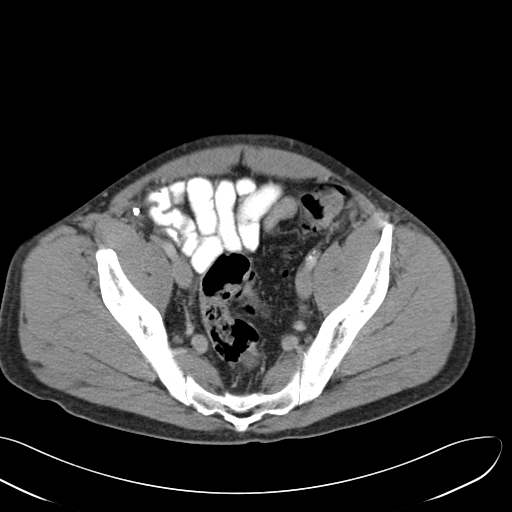
[im 46/99  soft-tissue]
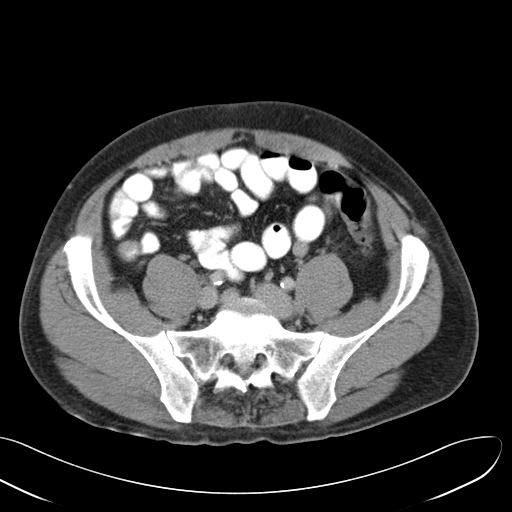
[im 53/99  soft-tissue]
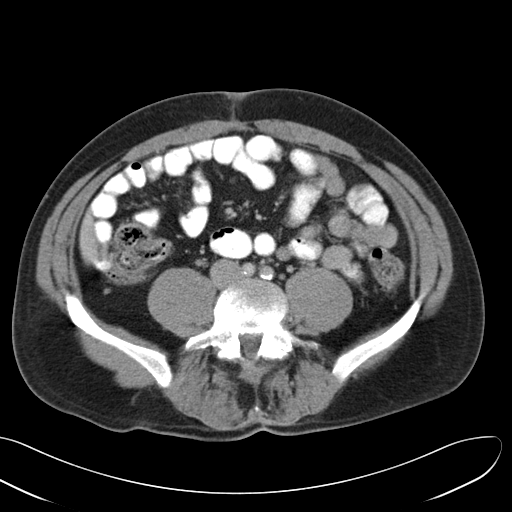
[im 61/99  soft-tissue]
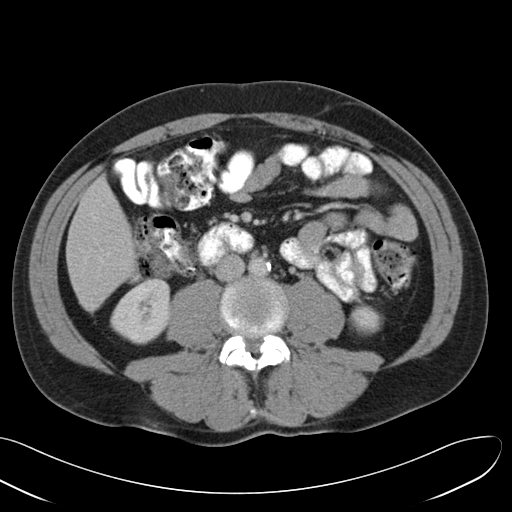
[im 68/99  soft-tissue]
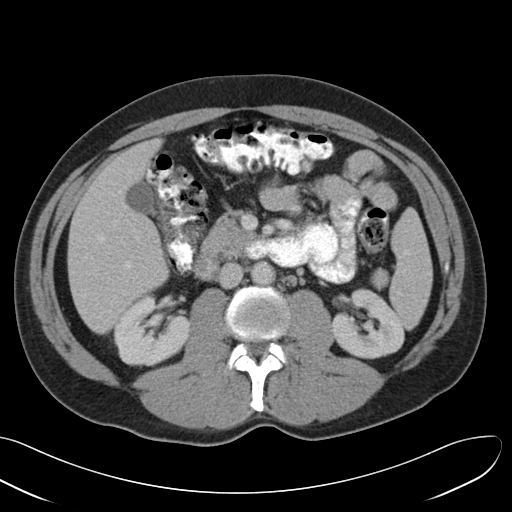
[im 68/99  lung]
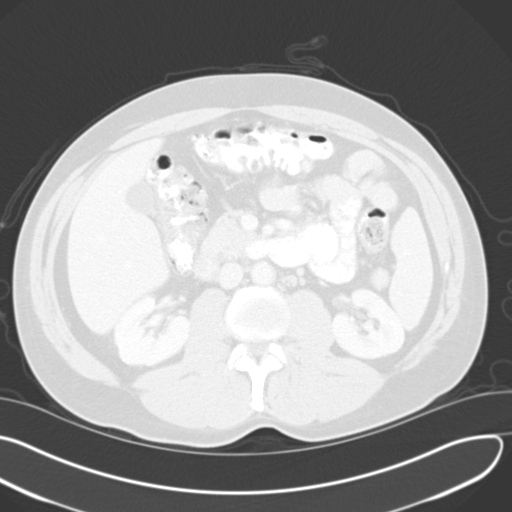
[im 68/99  bone]
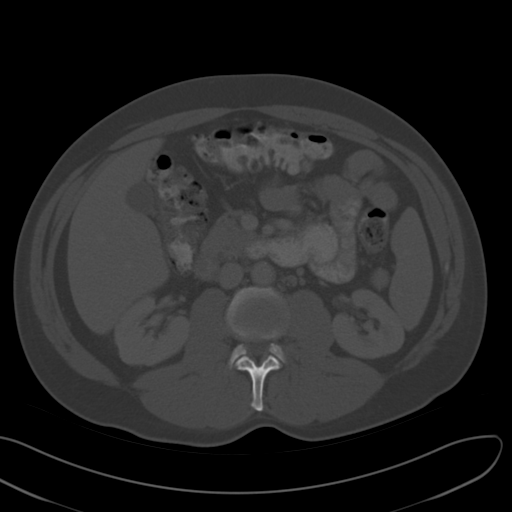
[im 76/99  soft-tissue]
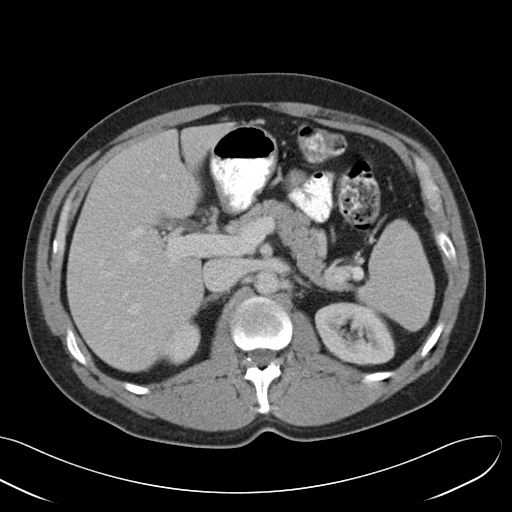
[im 76/99  lung]
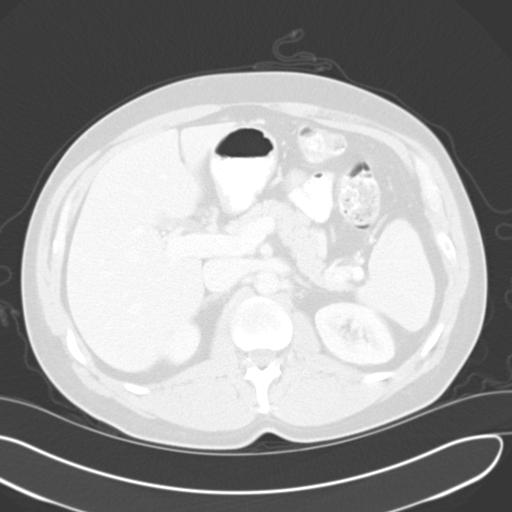
[im 83/99  soft-tissue]
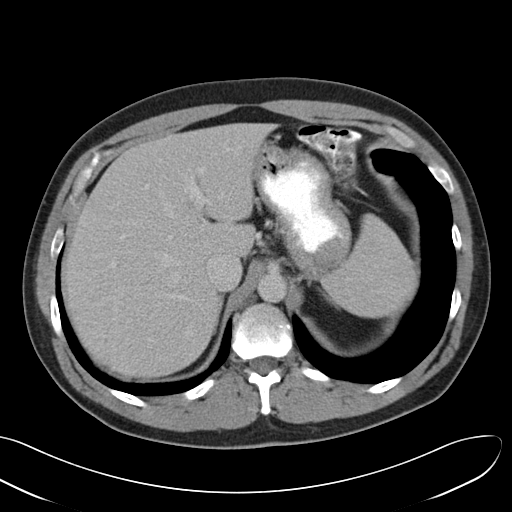
[im 83/99  lung]
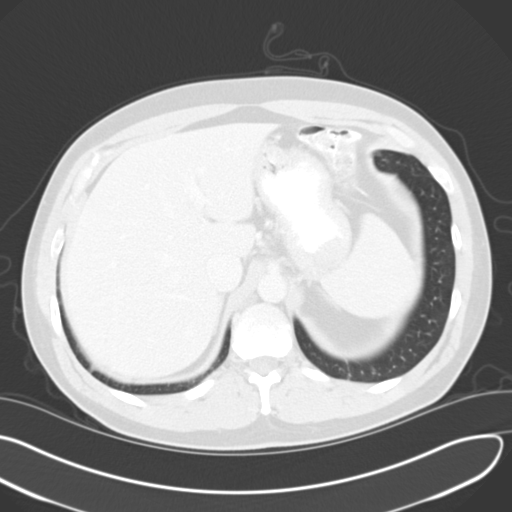
[im 91/99  soft-tissue]
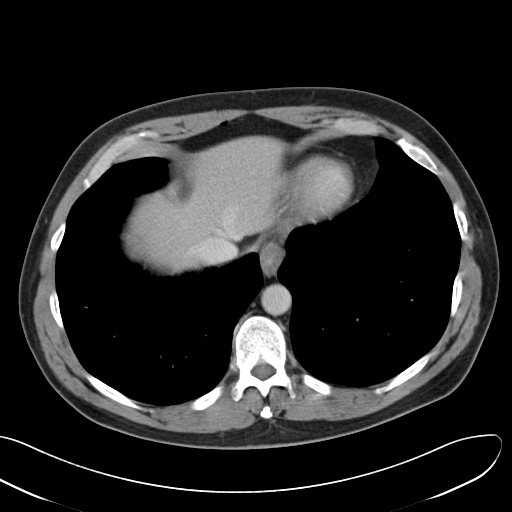
[im 91/99  lung]
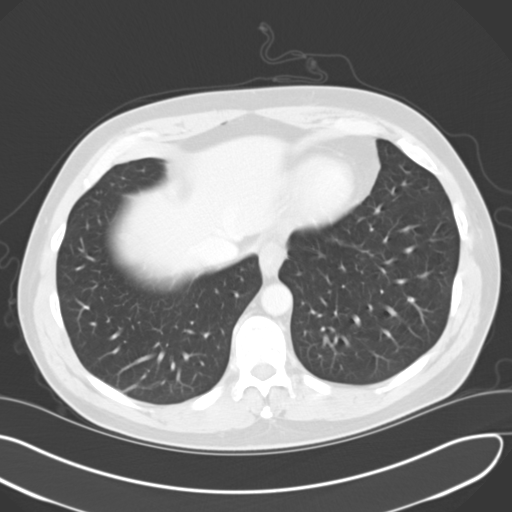

[12 of 32 positions shown; findings below may reference images not displayed]

FINDINGS: Hernia mesh is identified in the pelvis without evidence of
recurrent inguinal hernias. No ventral hernias are identified. No
focal abscess or free fluid.

The liver, gallbladder, pancreas, spleen, adrenal glands, kidneys
and bowel are within normal limits. No free air. No vascular
abnormalities are identified. No incidental mass lesions or enlarged
lymph nodes are seen. The bladder is unremarkable. Mild degenerative
disc disease present at L4-5 and L5-S1.
IMPRESSION: Unremarkable CT of the abdomen and pelvis. No evidence of recurrent
hernia is after recent hernia repair. No acute findings.

## 2016-08-21 ENCOUNTER — Emergency Department
Admission: EM | Admit: 2016-08-21 | Discharge: 2016-08-21 | Disposition: A | Discharge status: Home / Self Care | Payer: Self-pay | Attending: Emergency Medicine | Admitting: Emergency Medicine

## 2016-08-21 ENCOUNTER — Emergency Department: Payer: Self-pay

## 2016-08-21 DIAGNOSIS — R600 Localized edema: Secondary | ICD-10-CM | POA: Insufficient documentation

## 2016-08-21 DIAGNOSIS — F17210 Nicotine dependence, cigarettes, uncomplicated: Secondary | ICD-10-CM | POA: Insufficient documentation

## 2016-08-21 DIAGNOSIS — R1084 Generalized abdominal pain: Secondary | ICD-10-CM | POA: Insufficient documentation

## 2016-08-21 DIAGNOSIS — R609 Edema, unspecified: Secondary | ICD-10-CM

## 2016-08-21 HISTORY — DX: Gastro-esophageal reflux disease without esophagitis: K21.9

## 2016-08-21 LAB — BASIC METABOLIC PANEL
Anion gap: 9 (ref 5–15)
BUN: 8 mg/dL (ref 6–20)
CALCIUM: 9 mg/dL (ref 8.9–10.3)
CO2: 25 mmol/L (ref 22–32)
CREATININE: 1.2 mg/dL (ref 0.61–1.24)
Chloride: 102 mmol/L (ref 101–111)
GFR calc non Af Amer: >60 mL/min (ref >60)
Glucose, Bld: 118 mg/dL — ABNORMAL HIGH (ref 65–99)
Potassium: 3.1 mmol/L — ABNORMAL LOW (ref 3.5–5.1)
SODIUM: 136 mmol/L (ref 135–145)

## 2016-08-21 LAB — URINALYSIS, COMPLETE (UACMP) WITH MICROSCOPIC
BILIRUBIN URINE: NEGATIVE
Bacteria, UA: NONE SEEN
GLUCOSE, UA: NEGATIVE mg/dL
Ketones, ur: NEGATIVE mg/dL
Leukocytes, UA: NEGATIVE
NITRITE: NEGATIVE
Protein, ur: NEGATIVE mg/dL
SPECIFIC GRAVITY, URINE: 1.01 (ref 1.005–1.030)
Squamous Epithelial / LPF: NONE SEEN
WBC UA: NONE SEEN WBC/hpf (ref 0–5)
pH: 6 (ref 5.0–8.0)

## 2016-08-21 LAB — HEPATIC FUNCTION PANEL
ALBUMIN: 4.4 g/dL (ref 3.5–5.0)
ALT: 27 U/L (ref 17–63)
AST: 29 U/L (ref 15–41)
Alkaline Phosphatase: 68 U/L (ref 38–126)
BILIRUBIN TOTAL: 0.5 mg/dL (ref 0.3–1.2)
Total Protein: 7.3 g/dL (ref 6.5–8.1)

## 2016-08-21 LAB — CBC
HCT: 41.8 % (ref 40.0–52.0)
Hemoglobin: 14.9 g/dL (ref 13.0–18.0)
MCH: 35.8 pg — ABNORMAL HIGH (ref 26.0–34.0)
MCHC: 35.7 g/dL (ref 32.0–36.0)
MCV: 100.4 fL — AB (ref 80.0–100.0)
PLATELETS: 187 10*3/uL (ref 150–440)
RBC: 4.16 MIL/uL — AB (ref 4.40–5.90)
RDW: 14.9 % — AB (ref 11.5–14.5)
WBC: 6.3 10*3/uL (ref 3.8–10.6)

## 2016-08-21 LAB — TROPONIN I

## 2016-08-21 LAB — LIPASE, BLOOD: LIPASE: 32 U/L (ref 11–51)

## 2016-08-21 MED ORDER — GI COCKTAIL ~~LOC~~
30.0000 mL | Freq: Once | ORAL | Status: AC
  Administered 2016-08-21: 30 mL via ORAL
  Filled 2016-08-21: qty 30

## 2016-08-21 MED ORDER — HYOSCYAMINE SULFATE 0.125 MG PO TABS: 0.1250 mg | ORAL_TABLET | ORAL | 0 refills | Status: AC | PRN

## 2016-08-21 MED ORDER — POTASSIUM CHLORIDE CRYS ER 20 MEQ PO TBCR
40.0000 meq | EXTENDED_RELEASE_TABLET | Freq: Once | ORAL | Status: AC
  Administered 2016-08-21: 40 meq via ORAL
  Filled 2016-08-21: qty 2

## 2016-08-21 MED ORDER — SUCRALFATE 1 G PO TABS: 1.0000 g | ORAL_TABLET | Freq: Four times a day (QID) | ORAL | 0 refills | Status: AC

## 2016-08-21 NOTE — ED Provider Notes (Signed)
[facility] Emergency Department Provider Note  ____________________________________________   First MD Initiated Contact with Patient 08/21/16 1402     (approximate)  I have reviewed the triage vital signs and the nursing notes.   HISTORY  Chief Complaint Chest Pain and Diarrhea   HPI Carl Guerrero is a 51 y.o. male with a history of acid reflux and anxiety as well as chronic left upper quadrant pain was present in the emergency department with left upper quadrant pain over the past 3 days that is associated with nausea vomiting and diarrhea. He denies any blood in his vomitus or diarrhea. Says that he was diagnosed this past fall with a bleeding ulcer and started on a proton pump inhibitor as well as sucralfate. He is still significant ProTime pump inhibitor but is not taking any other medications. He describes the pain as aching and radiating up into his chest as well as down into his left lower abdomen. He denies any difficulty with urinating. Says that he has had about 2 episodes of diarrhea per day. Says that the pain as needed 10 right now. Does not report any shortness of breath. Says that he only drinks infrequently and denies any drug use. He was seen at [facility] for similar should this past November when he was diagnosed with an ulcer and then followed up with GI for an endoscopy as well as colonoscopy. He is also had multiple CAT scans which did not reveal acute pathology.  She also complaining of recent lower extremity swelling with the left being greater than the right.   Past Medical History:  Diagnosis Date  . Acid reflux   . Anxiety     There are no active problems to display for this patient.   Past Surgical History:  Procedure Laterality Date  . BACK SURGERY    . HERNIA REPAIR    . SHOULDER ARTHROSCOPY      Prior to Admission medications   Medication Sig Start Date End Date Taking? Authorizing Provider  ibuprofen (ADVIL,MOTRIN)  800 MG tablet Take 1 tablet (800 mg total) by mouth every 8 (eight) hours as needed. 11/02/15   Charmayne Sheer Beers, PA-C  methocarbamol (ROBAXIN) 500 MG tablet Take 1 tablet (500 mg total) by mouth every 6 (six) hours as needed for muscle spasms. 11/02/15   Charmayne Sheer Beers, PA-C  traMADol (ULTRAM) 50 MG tablet Take 1 tablet (50 mg total) by mouth every 6 (six) hours as needed. 11/02/15 11/01/16  Charmayne Sheer Beers, PA-C    Allergies Ivp dye [iodinated diagnostic agents]; Versed [midazolam]; Phenergan [promethazine]; and Latex  No family history on file.  Social History Social History  Substance Use Topics  . Smoking status: Current Every Day Smoker    Packs/day: 1.00    Types: Cigarettes  . Smokeless tobacco: Not on file  . Alcohol use Yes     Comment: rarely    Review of Systems Constitutional: No fever/chills Eyes: No visual changes. ENT: No sore throat. Cardiovascular: as above Respiratory: Denies shortness of breath. Gastrointestinal:no constipation. Genitourinary: Negative for dysuria. Musculoskeletal: Negative for back pain. Skin: Negative for rash. Neurological: Negative for headaches, focal weakness or numbness.  10-point ROS otherwise negative.  ____________________________________________   PHYSICAL EXAM:  VITAL SIGNS: ED Triage Vitals  Enc Vitals Group     BP 08/21/16 1137 (!) 154/74     Pulse Rate 08/21/16 1137 73     Resp 08/21/16 1137 18     Temp 08/21/16 1137  98.2 F (36.8 C)     Temp Source 08/21/16 1137 Oral     SpO2 08/21/16 1137 98 %     Weight 08/21/16 1135 190 lb (86.2 kg)     Height 08/21/16 1135 5\' 10"  (1.778 m)     Head Circumference --      Peak Flow --      Pain Score 08/21/16 1135 6     Pain Loc --      Pain Edu? --      Excl. in GC? --     Constitutional: Alert and oriented. Well appearing and in no acute distress. Eyes: Conjunctivae are normal. PERRL. EOMI. Head: Atraumatic. Nose: No congestion/rhinnorhea. Mouth/Throat: Mucous  membranes are moist.   Neck: No stridor.   Cardiovascular: Normal rate, regular rhythm. Grossly normal heart sounds.  Respiratory: Normal respiratory effort.  No retractions. Lungs CTAB. Gastrointestinal: Soft With mild to moderate diffuse abdominal pain. No rebound or guarding. No distention.  No CVA tenderness. Musculoskeletal: No lower extremity tenderness.  Mild bilateral lower extremity edema with the left being somewhat greater than the right.  No joint effusions. Neurologic:  Normal speech and language. No gross focal neurologic deficits are appreciated. No gait instability. Skin:  Skin is warm, dry and intact. No rash noted. Psychiatric: Mood and affect are normal. Speech and behavior are normal.  ____________________________________________   LABS (all labs ordered are listed, but only abnormal results are displayed)  Labs Reviewed  BASIC METABOLIC PANEL - Abnormal; Notable for the following:       Result Value   Potassium 3.1 (*)    Glucose, Bld 118 (*)    All other components within normal limits  CBC - Abnormal; Notable for the following:    RBC 4.16 (*)    MCV 100.4 (*)    MCH 35.8 (*)    RDW 14.9 (*)    All other components within normal limits  HEPATIC FUNCTION PANEL - Abnormal; Notable for the following:    Bilirubin, Direct <0.1 (*)    All other components within normal limits  URINALYSIS, COMPLETE (UACMP) WITH MICROSCOPIC - Abnormal; Notable for the following:    Color, Urine STRAW (*)    APPearance CLEAR (*)    Hgb urine dipstick MODERATE (*)    All other components within normal limits  TROPONIN I  LIPASE, BLOOD   ____________________________________________  EKG  ED ECG REPORT I, Arelia Longest, the attending physician, personally viewed and interpreted this ECG.   Date: 08/21/2016  EKG Time: 1137  Rate: 69  Rhythm: normal sinus rhythm  Axis: normal axis  Intervals:none  ST&T Change: No ST segment elevation or depression. No abnormal T-wave  inversion.  ____________________________________________  RADIOLOGY  DG Chest 2 View (Accession [id number]) (Order [id number])  Imaging  Date: 08/21/2016 Department: [facility] EMERGENCY DEPARTMENT Released By: Lynelle Smoke, RN (auto-released) Authorizing: Myrna Blazer, MD  Exam Information   Status Exam Begun  Exam Ended   Final [99] 08/21/2016 11:47 AM 08/21/2016 11:54 AM  PACS Images   Show images for DG Chest 2 View  Study Result   CLINICAL DATA:  Epigastric pain.  Left-sided chest pain for 2 days.  EXAM: CHEST  2 VIEW  COMPARISON:  Acute abdominal series 11/02/2015  FINDINGS: The heart size and mediastinal contours are within normal limits. Both lungs are clear. Mild emphysematous changes are present. The visualized skeletal structures are unremarkable.  IMPRESSION: 1. No acute cardiopulmonary disease. 2. Mild  emphysema.   Electronically Signed   By: Marin Robertshristopher  Mattern M.D.   On: 08/21/2016 12:29    US Venous Img Lower Bilateral (Final result)  Result time 08/21/16 17:00:59  Final result by Ulyses SouthwardMark Boles, MD (08/21/16 17:00:59)           Narrative:   CLINICAL DATA: BILATERAL lower extremity swelling LEFT greater than RIGHT for 1 day  EXAM: BILATERAL LOWER EXTREMITY VENOUS DOPPLER ULTRASOUND  TECHNIQUE: Gray-scale sonography with graded compression, as well as color Doppler and duplex ultrasound were performed to evaluate the lower extremity deep venous systems from the level of the common femoral vein and including the common femoral, femoral, profunda femoral, popliteal and calf veins including the posterior tibial, peroneal and gastrocnemius veins when visible. The superficial great saphenous vein was also interrogated. Spectral Doppler was utilized to evaluate flow at rest and with distal augmentation maneuvers in the common femoral, femoral and popliteal veins.  COMPARISON: None  FINDINGS: RIGHT LOWER  EXTREMITY  Common Femoral Vein: No evidence of thrombus. Normal compressibility, respiratory phasicity and response to augmentation.  Saphenofemoral Junction: No evidence of thrombus. Normal compressibility and flow on color Doppler imaging.  Profunda Femoral Vein: No evidence of thrombus. Normal compressibility and flow on color Doppler imaging.  Femoral Vein: No evidence of thrombus. Normal compressibility, respiratory phasicity and response to augmentation.  Popliteal Vein: No evidence of thrombus. Normal compressibility, respiratory phasicity and response to augmentation.  Calf Veins: No evidence of thrombus. Normal compressibility and flow on color Doppler imaging.  Superficial Great Saphenous Vein: No evidence of thrombus. Normal compressibility and flow on color Doppler imaging.  Venous Reflux: None.  Other Findings: None.  LEFT LOWER EXTREMITY  Common Femoral Vein: No evidence of thrombus. Normal compressibility, respiratory phasicity and response to augmentation.  Saphenofemoral Junction: No evidence of thrombus. Normal compressibility and flow on color Doppler imaging.  Profunda Femoral Vein: No evidence of thrombus. Normal compressibility and flow on color Doppler imaging.  Femoral Vein: No evidence of thrombus. Normal compressibility, respiratory phasicity and response to augmentation.  Popliteal Vein: No evidence of thrombus. Normal compressibility, respiratory phasicity and response to augmentation.  Calf Veins: No evidence of thrombus. Normal compressibility and flow on color Doppler imaging.  Superficial Great Saphenous Vein: No evidence of thrombus. Normal compressibility and flow on color Doppler imaging.  Venous Reflux: None.  Other Findings: None.  IMPRESSION: No evidence of deep venous thrombosis in either lower extremity.   Electronically Signed By: Ulyses SouthwardMark Boles M.D. On: 08/21/2016 17:00             ____________________________________________   PROCEDURES  Procedure(s) performed:   Procedures  Critical Care performed:   ____________________________________________   INITIAL IMPRESSION / ASSESSMENT AND PLAN / ED COURSE  Pertinent labs & imaging results that were available during my care of the patient were reviewed by me and considered in my medical decision making (see chart for details).  Previous records reviewed on care anywhere and it appears the patient has had multiple previous visits with similar presentations. His abdominal examination also appears consistent with previous exams.  Clinical Course   ----------------------------------------- 5:41 PM on 08/21/2016 -----------------------------------------  Patient resting comfortably at this time. If tolerated GI cocktail. No reports of any further episodes of vomiting or diarrhea in the emergency department here. Likely return of gastric symptoms. I will discharge the patient with Hycotuss vein as well as sucralfate which she been taking before and helped him greatly. No DVTs to the lower extremities. Likely peripheral  edema. No follow-up at [facility] with his known gastroenterologist. He is understanding of the diagnosis as well as laboratory results and wanted to comply with plan.    ____________________________________________   FINAL CLINICAL IMPRESSION(S) / ED DIAGNOSES  Generalized abdominal pain. Peripheral edema.    NEW MEDICATIONS STARTED DURING THIS VISIT:  New Prescriptions   No medications on file     Note:  This document was prepared using Dragon voice recognition software and may include unintentional dictation errors.    Myrna Blazer, MD 08/21/16 [phone]

## 2016-08-21 NOTE — ED Triage Notes (Signed)
Pt reports left sided CP and diarrhea X 2 days. Pt alert and oriented X4, active, cooperative, pt in NAD. RR even and unlabored, color WNL.

## 2017-08-16 ENCOUNTER — Emergency Department: Payer: Self-pay

## 2017-08-16 ENCOUNTER — Emergency Department
Admission: EM | Admit: 2017-08-16 | Discharge: 2017-08-16 | Disposition: A | Discharge status: Home / Self Care | Payer: Self-pay | Attending: Emergency Medicine | Admitting: Emergency Medicine

## 2017-08-16 ENCOUNTER — Encounter: Payer: Self-pay | Admitting: Emergency Medicine

## 2017-08-16 ENCOUNTER — Other Ambulatory Visit: Payer: Self-pay

## 2017-08-16 DIAGNOSIS — Z23 Encounter for immunization: Secondary | ICD-10-CM | POA: Insufficient documentation

## 2017-08-16 DIAGNOSIS — Z91040 Latex allergy status: Secondary | ICD-10-CM | POA: Insufficient documentation

## 2017-08-16 DIAGNOSIS — Y999 Unspecified external cause status: Secondary | ICD-10-CM | POA: Insufficient documentation

## 2017-08-16 DIAGNOSIS — Y9301 Activity, walking, marching and hiking: Secondary | ICD-10-CM | POA: Insufficient documentation

## 2017-08-16 DIAGNOSIS — F17210 Nicotine dependence, cigarettes, uncomplicated: Secondary | ICD-10-CM | POA: Insufficient documentation

## 2017-08-16 DIAGNOSIS — Y929 Unspecified place or not applicable: Secondary | ICD-10-CM | POA: Insufficient documentation

## 2017-08-16 DIAGNOSIS — W450XXA Nail entering through skin, initial encounter: Secondary | ICD-10-CM | POA: Insufficient documentation

## 2017-08-16 DIAGNOSIS — S91331A Puncture wound without foreign body, right foot, initial encounter: Secondary | ICD-10-CM | POA: Insufficient documentation

## 2017-08-16 MED ORDER — AMOXICILLIN-POT CLAVULANATE 875-125 MG PO TABS: 1.0000 | ORAL_TABLET | Freq: Two times a day (BID) | ORAL | 0 refills | Status: DC

## 2017-08-16 MED ORDER — TETANUS-DIPHTH-ACELL PERTUSSIS 5-2.5-18.5 LF-MCG/0.5 IM SUSP
0.5000 mL | Freq: Once | INTRAMUSCULAR | Status: AC
  Administered 2017-08-16: 0.5 mL via INTRAMUSCULAR
  Filled 2017-08-16: qty 0.5

## 2017-08-16 MED ORDER — TRAMADOL HCL 50 MG PO TABS: 50.0000 mg | ORAL_TABLET | Freq: Four times a day (QID) | ORAL | 0 refills | Status: AC | PRN

## 2017-08-16 MED ORDER — MELOXICAM 15 MG PO TABS: 15.0000 mg | ORAL_TABLET | Freq: Every day | ORAL | 0 refills | Status: AC

## 2017-08-16 MED ORDER — OXYCODONE-ACETAMINOPHEN 5-325 MG PO TABS
1.0000 | ORAL_TABLET | Freq: Once | ORAL | Status: AC
  Administered 2017-08-16: 1 via ORAL
  Filled 2017-08-16: qty 1

## 2017-08-16 NOTE — ED Provider Notes (Signed)
[facility] Emergency Department Provider Note  ____________________________________________  Time seen: Approximately 8:23 PM  I have reviewed the triage vital signs and the nursing notes.   HISTORY  Chief Complaint Puncture Wound    HPI Carl Guerrero is a 52 y.o. male who presents emergency department complaining of puncture wound to the plantar aspect of the right foot.  Patient reports that he was walking today when he stepped on an exposed rusty nail.  Patient reports that the nail was at least 3 inches long and he reports that at least 1-1/2 inches went into his foot.  Patient is unsure of his last tetanus shot.  He reports that there is pain to the ball of his foot, where the puncture wound occurred. patient tried cleaning at home.  Patient states that there is no significant sock or shoe material missing.  Patient is able to ambulate on foot but doing so increases pain.  No other injury or complaint.  No medications for this complaint prior to arrival.  Past Medical History:  Diagnosis Date  . Acid reflux   . Anxiety     There are no active problems to display for this patient.   Past Surgical History:  Procedure Laterality Date  . BACK SURGERY    . HERNIA REPAIR    . SHOULDER ARTHROSCOPY      Prior to Admission medications   Medication Sig Start Date End Date Taking? Authorizing Provider  amoxicillin-clavulanate (AUGMENTIN) 875-125 MG tablet Take 1 tablet by mouth 2 (two) times daily. 08/16/17   Lynford Espinoza, Delorise Royals, PA-C  hyoscyamine (LEVSIN, ANASPAZ) 0.125 MG tablet Take 1 tablet (0.125 mg total) by mouth every 4 (four) hours as needed for cramping. 08/21/16   Schaevitz, Myra Rude, MD  ibuprofen (ADVIL,MOTRIN) 800 MG tablet Take 1 tablet (800 mg total) by mouth every 8 (eight) hours as needed. 11/02/15   Beers, Charmayne Sheer, PA-C  meloxicam (MOBIC) 15 MG tablet Take 1 tablet (15 mg total) by mouth daily. 08/16/17   Shreyas Piatkowski, Delorise Royals, PA-C   methocarbamol (ROBAXIN) 500 MG tablet Take 1 tablet (500 mg total) by mouth every 6 (six) hours as needed for muscle spasms. 11/02/15   Beers, Charmayne Sheer, PA-C  sucralfate (CARAFATE) 1 g tablet Take 1 tablet (1 g total) by mouth 4 (four) times daily. 08/21/16 08/21/17  Schaevitz, Myra Rude, MD  traMADol (ULTRAM) 50 MG tablet Take 1 tablet (50 mg total) by mouth every 6 (six) hours as needed. 08/16/17   Abagale Boulos, Delorise Royals, PA-C    Allergies Ivp dye [iodinated diagnostic agents]; Versed [midazolam]; Phenergan [promethazine]; and Latex  History reviewed. No pertinent family history.  Social History Social History   Tobacco Use  . Smoking status: Current Every Day Smoker    Packs/day: 1.00    Types: Cigarettes  . Smokeless tobacco: Never Used  Substance Use Topics  . Alcohol use: Yes    Comment: rarely  . Drug use: Yes    Types: Marijuana     Review of Systems  Constitutional: No fever/chills Eyes: No visual changes.  Cardiovascular: no chest pain. Respiratory: no cough. No SOB. Gastrointestinal: No abdominal pain.  No nausea, no vomiting.   Musculoskeletal: Positive for puncture wound/foot injury to the plantar aspect of the right foot. Skin: Negative for rash, abrasions, lacerations, ecchymosis. Neurological: Negative for headaches, focal weakness or numbness. 10-point ROS otherwise negative.  ____________________________________________   PHYSICAL EXAM:  VITAL SIGNS: ED Triage Vitals  Enc Vitals Group  BP 08/16/17 1909 (!) 155/91     Pulse Rate 08/16/17 1909 (!) 101     Resp 08/16/17 1909 17     Temp 08/16/17 1909 97.8 F (36.6 C)     Temp Source 08/16/17 1909 Oral     SpO2 08/16/17 1909 98 %     Weight 08/16/17 1910 190 lb (86.2 kg)     Height --      Head Circumference --      Peak Flow --      Pain Score 08/16/17 1948 7     Pain Loc --      Pain Edu? --      Excl. in GC? --      Constitutional: Alert and oriented. Well appearing and in no acute  distress. Eyes: Conjunctivae are normal. PERRL. EOMI. Head: Atraumatic. Neck: No stridor.    Cardiovascular: Normal rate, regular rhythm. Normal S1 and S2.  Good peripheral circulation. Respiratory: Normal respiratory effort without tachypnea or retractions. Lungs CTAB. Good air entry to the bases with no decreased or absent breath sounds. Musculoskeletal: Full range of motion to all extremities. No gross deformities appreciated. Neurologic:  Normal speech and language. No gross focal neurologic deficits are appreciated.  Skin:  Skin is warm, dry and intact. No rash noted.  Single puncture wound noted to the plantar aspect of the right foot.  This underlies the distal aspect of the third metatarsal.  Patient is tender to palpation over the puncture wound but no other tenderness to palpation over the osseous structures of the right foot.  Full range of motion to the ankle and all digits right foot.  No surrounding erythema or edema.  No bleeding or drainage.  No visible foreign body. Psychiatric: Mood and affect are normal. Speech and behavior are normal. Patient exhibits appropriate insight and judgement.   ____________________________________________   LABS (all labs ordered are listed, but only abnormal results are displayed)  Labs Reviewed - No data to display ____________________________________________  EKG   ____________________________________________  RADIOLOGY Festus Barren Tateanna Bach, personally viewed and evaluated these images (plain radiographs) as part of my medical decision making, as well as reviewing the written report by the radiologist.  Dg Foot Complete Right  Result Date: 08/16/2017 CLINICAL DATA:  Patient stepped on a nail today at work. Puncture wound along the plantar aspect of the foot. EXAM: RIGHT FOOT COMPLETE - 3+ VIEW COMPARISON:  None. FINDINGS: No foreign body or definite soft tissue emphysema demonstrated. There is no evidence of acute fracture, dislocation  or bone destruction. The joint spaces appear intact. IMPRESSION: No acute osseous findings. No foreign body or soft tissue emphysema identified. Electronically Signed   By: Carey Bullocks M.D.   On: 08/16/2017 21:00    ____________________________________________    PROCEDURES  Procedure(s) performed:    Procedures    Medications  Tdap (BOOSTRIX) injection 0.5 mL (0.5 mLs Intramuscular Given 08/16/17 2038)  oxyCODONE-acetaminophen (PERCOCET/ROXICET) 5-325 MG per tablet 1 tablet (1 tablet Oral Given 08/16/17 2037)     ____________________________________________   INITIAL IMPRESSION / ASSESSMENT AND PLAN / ED COURSE  Pertinent labs & imaging results that were available during my care of the patient were reviewed by me and considered in my medical decision making (see chart for details).  Review of the [place] CSRS was performed in accordance of the [facility] prior to dispensing any controlled drugs.     Patient's diagnosis is consistent with a wound to the right foot.  X-ray reveals  no acute osseous abnormality or significant retained body.  My exam reveals no significant foreign body.  Patient will be given his tetanus booster here in the emergency department.  Patient will be placed on antibiotics prophylactically and given meloxicam and limited prescription for Ultram for pain.  Patient is to follow-up with primary care or podiatry as needed..  Patient is given ED precautions to return to the ED for any worsening or new symptoms.     ____________________________________________  FINAL CLINICAL IMPRESSION(S) / ED DIAGNOSES  Final diagnoses:  Puncture wound of right foot, initial encounter      NEW MEDICATIONS STARTED DURING THIS VISIT:  ED Discharge Orders        Ordered    amoxicillin-clavulanate (AUGMENTIN) 875-125 MG tablet  2 times daily     08/16/17 2120    meloxicam (MOBIC) 15 MG tablet  Daily     08/16/17 2120    traMADol (ULTRAM) 50 MG tablet  Every 6 hours PRN      08/16/17 2120          This chart was dictated using voice recognition software/Dragon. Despite best efforts to proofread, errors can occur which can change the meaning. Any change was purely unintentional.    Racheal PatchesCuthriell, Eidan Muellner D, PA-C 08/16/17 2127    Minna AntisPaduchowski, Kevin, MD 08/16/17 2318

## 2017-08-16 NOTE — ED Notes (Signed)
PT states that he stepped on a rusty nail and it went about a inch and half into his right foot. Happened about 1:00pm this afternoon. Pt states he is not sure when he last got his tetanus shot.

## 2017-08-16 NOTE — ED Triage Notes (Signed)
Pt reports he stepped on nail while working today. Pt has a puncture wound to the right bottom of foot. Pt unaware of last tetanus shot.

## 2018-08-02 ENCOUNTER — Emergency Department
Admission: EM | Admit: 2018-08-02 | Discharge: 2018-08-02 | Disposition: A | Discharge status: Home / Self Care | Payer: Self-pay | Attending: Emergency Medicine | Admitting: Emergency Medicine

## 2018-08-02 ENCOUNTER — Emergency Department: Payer: Self-pay

## 2018-08-02 ENCOUNTER — Other Ambulatory Visit: Payer: Self-pay

## 2018-08-02 ENCOUNTER — Encounter: Payer: Self-pay | Admitting: Emergency Medicine

## 2018-08-02 DIAGNOSIS — Z79899 Other long term (current) drug therapy: Secondary | ICD-10-CM | POA: Insufficient documentation

## 2018-08-02 DIAGNOSIS — F17210 Nicotine dependence, cigarettes, uncomplicated: Secondary | ICD-10-CM | POA: Insufficient documentation

## 2018-08-02 DIAGNOSIS — J9801 Acute bronchospasm: Secondary | ICD-10-CM | POA: Insufficient documentation

## 2018-08-02 HISTORY — DX: Unspecified fracture of second thoracic vertebra, initial encounter for closed fracture: S22.029A

## 2018-08-02 MED ORDER — IPRATROPIUM-ALBUTEROL 0.5-2.5 (3) MG/3ML IN SOLN
3.0000 mL | Freq: Once | RESPIRATORY_TRACT | Status: AC
  Administered 2018-08-02: 3 mL via RESPIRATORY_TRACT
  Filled 2018-08-02: qty 3

## 2018-08-02 MED ORDER — HYDROCOD POLST-CPM POLST ER 10-8 MG/5ML PO SUER: 5.0000 mL | Freq: Two times a day (BID) | ORAL | 0 refills | Status: AC

## 2018-08-02 MED ORDER — METHYLPREDNISOLONE 4 MG PO TBPK: ORAL_TABLET | ORAL | 0 refills | Status: AC

## 2018-08-02 NOTE — ED Notes (Signed)
First Nurse Note: Patient complaining of SHOB, pulse ox 95% on RA.  Color good.

## 2018-08-02 NOTE — ED Provider Notes (Signed)
[facility] Emergency Department Provider Note   ____________________________________________   First MD Initiated Contact with Patient 08/02/18 1046     (approximate)  I have reviewed the triage vital signs and the nursing notes.   HISTORY  Chief Complaint Cough    HPI Carl Guerrero is a 52 y.o. male patient presents with productive cough for 4 to 5 days.  Patient states shortness of breath.  Patient to cough increase with deep inspirations.  Patient states chest wall pain secondary to the cough.  Patient also has nasal congestion.  Patient denies sore throat.  Patient denies nausea, vomiting, diarrhea.  Patient rates his pain describes a 5/10.  Patient describes his pain is "aching".  No palliative measures prior to arrival.   Past Medical History:  Diagnosis Date  . Acid reflux   . Anxiety   . Fracture of T2 vertebra (HCC)     There are no active problems to display for this patient.   Past Surgical History:  Procedure Laterality Date  . BACK SURGERY    . HERNIA REPAIR    . SHOULDER ARTHROSCOPY      Prior to Admission medications   Medication Sig Start Date End Date Taking? Authorizing Provider  amoxicillin-clavulanate (AUGMENTIN) 875-125 MG tablet Take 1 tablet by mouth 2 (two) times daily. 08/16/17   Cuthriell, Delorise RoyalsJonathan D, PA-C  chlorpheniramine-HYDROcodone (TUSSIONEX PENNKINETIC ER) 10-8 MG/5ML SUER Take 5 mLs by mouth 2 (two) times daily. 08/02/18   Joni ReiningSmith, Lataria Courser K, PA-C  hyoscyamine (LEVSIN, ANASPAZ) 0.125 MG tablet Take 1 tablet (0.125 mg total) by mouth every 4 (four) hours as needed for cramping. 08/21/16   Schaevitz, Myra Rudeavid Matthew, MD  ibuprofen (ADVIL,MOTRIN) 800 MG tablet Take 1 tablet (800 mg total) by mouth every 8 (eight) hours as needed. 11/02/15   Beers, Charmayne Sheerharles M, PA-C  meloxicam (MOBIC) 15 MG tablet Take 1 tablet (15 mg total) by mouth daily. 08/16/17   Cuthriell, Delorise RoyalsJonathan D, PA-C  methocarbamol (ROBAXIN) 500 MG tablet Take 1  tablet (500 mg total) by mouth every 6 (six) hours as needed for muscle spasms. 11/02/15   Beers, Charmayne Sheerharles M, PA-C  methylPREDNISolone (MEDROL DOSEPAK) 4 MG TBPK tablet Take Tapered dose as directed 08/02/18   Joni ReiningSmith, Dary Dilauro K, PA-C  sucralfate (CARAFATE) 1 g tablet Take 1 tablet (1 g total) by mouth 4 (four) times daily. 08/21/16 08/21/17  Schaevitz, Myra Rudeavid Matthew, MD  traMADol (ULTRAM) 50 MG tablet Take 1 tablet (50 mg total) by mouth every 6 (six) hours as needed. 08/16/17   Cuthriell, Delorise RoyalsJonathan D, PA-C    Allergies Ivp dye [iodinated diagnostic agents]; Versed [midazolam]; Phenergan [promethazine]; and Latex  No family history on file.  Social History Social History   Tobacco Use  . Smoking status: Current Every Day Smoker    Packs/day: 1.00    Types: Cigarettes  . Smokeless tobacco: Never Used  Substance Use Topics  . Alcohol use: Yes    Comment: rarely  . Drug use: Yes    Types: Marijuana    Review of Systems Constitutional: No fever/chills Eyes: No visual changes. ENT: No sore throat. Cardiovascular: Denies chest pain. Respiratory: States shortness of breath.  Productive cough and chest congestion. Gastrointestinal: No abdominal pain.  No nausea, no vomiting.  No diarrhea.  No constipation. Genitourinary: Negative for dysuria. Musculoskeletal: Negative for back pain. Skin: Negative for rash. Neurological: Negative for headaches, focal weakness or numbness. Psychiatric:Anxiety. Allergic/Immunilogical: See medication list. ____________________________________________   PHYSICAL EXAM:  VITAL  SIGNS: ED Triage Vitals  Enc Vitals Group     BP 08/02/18 1028 137/67     Pulse Rate 08/02/18 1028 (!) 122     Resp 08/02/18 1028 20     Temp 08/02/18 1028 97.7 F (36.5 C)     Temp Source 08/02/18 1028 Oral     SpO2 08/02/18 1028 97 %     Weight 08/02/18 1028 218 lb (98.9 kg)     Height 08/02/18 1028 5\' 10"  (1.778 m)     Head Circumference --      Peak Flow --      Pain  Score 08/02/18 1039 5     Pain Loc --      Pain Edu? --      Excl. in GC? --    Constitutional: Alert and oriented. Well appearing and in no acute distress. Nose: Edematous nasal turbinates clear rhinorrhea.   Mouth/Throat: Mucous membranes are moist.  Oropharynx non-erythematous.  Postnasal drainage. Neck: No stridor. Hematological/Lymphatic/Immunilogical: No cervical lymphadenopathy. Cardiovascular: Normal rate, regular rhythm. Grossly normal heart sounds.  Good peripheral circulation. Respiratory: Normal respiratory effort.  No retractions. Lungs moderate wheezing and rales. Skin:  Skin is warm, dry and intact. No rash noted. Psychiatric: Mood and affect are normal. Speech and behavior are normal.  ____________________________________________   LABS (all labs ordered are listed, but only abnormal results are displayed)  Labs Reviewed - No data to display ____________________________________________  EKG   ____________________________________________  RADIOLOGY  ED MD interpretation:    Official radiology report(s): Dg Chest 2 View  Result Date: 08/02/2018 CLINICAL DATA:  Cough since 07/28/2018. EXAM: CHEST - 2 VIEW COMPARISON:  PA and lateral chest 08/21/2016. FINDINGS: The chest is hyperexpanded with bullous change in the apices. No consolidative process, pneumothorax or effusion. Heart size is normal. No acute or focal bony abnormality. Postoperative change left shoulder noted. IMPRESSION: No acute disease. Emphysema. Electronically Signed   By: Drusilla Kannerhomas  Dalessio M.D.   On: 08/02/2018 11:04    ____________________________________________   PROCEDURES  Procedure(s) performed: None  Procedures  Critical Care performed: No  ____________________________________________   INITIAL IMPRESSION / ASSESSMENT AND PLAN / ED COURSE  As part of my medical decision making, I reviewed the following data within the electronic MEDICAL RECORD NUMBER    Cough secondary to  bronchospasm.  Discussed x-ray finding with patient.  Patient given a DuoNeb treatment.  Patient given a prescription for Medrol Dosepak and Tussionex.  Patient [facility].      ____________________________________________   FINAL CLINICAL IMPRESSION(S) / ED DIAGNOSES  Final diagnoses:  Cough due to bronchospasm     ED Discharge Orders         Ordered    methylPREDNISolone (MEDROL DOSEPAK) 4 MG TBPK tablet     08/02/18 1120    chlorpheniramine-HYDROcodone (TUSSIONEX PENNKINETIC ER) 10-8 MG/5ML SUER  2 times daily     08/02/18 1120           Note:  This document was prepared using Dragon voice recognition software and may include unintentional dictation errors.    Joni ReiningSmith, Kalaya Infantino K, PA-C 08/02/18 1123    Emily FilbertWilliams, Jonathan E, MD 08/02/18 [phone]

## 2018-08-02 NOTE — ED Notes (Signed)
See triage note    States he developed cough this past weekend    Also has had some SOB  Afebrile on arrival

## 2018-08-02 NOTE — ED Triage Notes (Signed)
Says cough since Saturday.  Says short of breath since couple days ago.  Sounds congested.

## 2018-08-09 ENCOUNTER — Emergency Department
Admission: EM | Admit: 2018-08-09 | Discharge: 2018-08-09 | Disposition: A | Discharge status: Home / Self Care | Payer: Self-pay | Attending: Emergency Medicine | Admitting: Emergency Medicine

## 2018-08-09 ENCOUNTER — Emergency Department: Payer: Self-pay

## 2018-08-09 ENCOUNTER — Other Ambulatory Visit: Payer: Self-pay

## 2018-08-09 ENCOUNTER — Encounter: Payer: Self-pay | Admitting: Emergency Medicine

## 2018-08-09 DIAGNOSIS — Z91040 Latex allergy status: Secondary | ICD-10-CM | POA: Insufficient documentation

## 2018-08-09 DIAGNOSIS — F17210 Nicotine dependence, cigarettes, uncomplicated: Secondary | ICD-10-CM | POA: Insufficient documentation

## 2018-08-09 DIAGNOSIS — J40 Bronchitis, not specified as acute or chronic: Secondary | ICD-10-CM | POA: Insufficient documentation

## 2018-08-09 LAB — CBC WITH DIFFERENTIAL/PLATELET
ABS IMMATURE GRANULOCYTES: 0.36 10*3/uL — AB (ref 0.00–0.07)
Basophils Absolute: 0.1 10*3/uL (ref 0.0–0.1)
Basophils Relative: 0 %
Eosinophils Absolute: 0.1 10*3/uL (ref 0.0–0.5)
Eosinophils Relative: 1 %
HCT: 44.8 % (ref 39.0–52.0)
HEMOGLOBIN: 15.8 g/dL (ref 13.0–17.0)
Immature Granulocytes: 2 %
Lymphocytes Relative: 15 %
Lymphs Abs: 2.7 10*3/uL (ref 0.7–4.0)
MCH: 34.4 pg — ABNORMAL HIGH (ref 26.0–34.0)
MCHC: 35.3 g/dL (ref 30.0–36.0)
MCV: 97.6 fL (ref 80.0–100.0)
MONO ABS: 1.2 10*3/uL — AB (ref 0.1–1.0)
Monocytes Relative: 7 %
NEUTROS ABS: 13.6 10*3/uL — AB (ref 1.7–7.7)
Neutrophils Relative %: 75 %
Platelets: 280 10*3/uL (ref 150–400)
RBC: 4.59 MIL/uL (ref 4.22–5.81)
RDW: 12.8 % (ref 11.5–15.5)
WBC: 18 10*3/uL — ABNORMAL HIGH (ref 4.0–10.5)
nRBC: 0 % (ref 0.0–0.2)

## 2018-08-09 LAB — BASIC METABOLIC PANEL
Anion gap: 8 (ref 5–15)
BUN: 12 mg/dL (ref 6–20)
CO2: 24 mmol/L (ref 22–32)
Calcium: 8.5 mg/dL — ABNORMAL LOW (ref 8.9–10.3)
Chloride: 106 mmol/L (ref 98–111)
Creatinine, Ser: 1.4 mg/dL — ABNORMAL HIGH (ref 0.61–1.24)
GFR calc Af Amer: >60 mL/min (ref >60)
GFR calc non Af Amer: 58 mL/min — ABNORMAL LOW (ref >60)
Glucose, Bld: 81 mg/dL (ref 70–99)
POTASSIUM: 3.1 mmol/L — AB (ref 3.5–5.1)
Sodium: 138 mmol/L (ref 135–145)

## 2018-08-09 LAB — INFLUENZA PANEL BY PCR (TYPE A & B)
INFLBPCR: NEGATIVE
Influenza A By PCR: NEGATIVE

## 2018-08-09 LAB — BRAIN NATRIURETIC PEPTIDE: B NATRIURETIC PEPTIDE 5: 32 pg/mL (ref 0.0–100.0)

## 2018-08-09 LAB — TROPONIN I: Troponin I: <0.03 ng/mL (ref <0.03)

## 2018-08-09 MED ORDER — AZITHROMYCIN 500 MG PO TABS
500.0000 mg | ORAL_TABLET | Freq: Once | ORAL | Status: AC
  Administered 2018-08-09: 500 mg via ORAL
  Filled 2018-08-09: qty 1

## 2018-08-09 MED ORDER — METHYLPREDNISOLONE SODIUM SUCC 125 MG IJ SOLR
125.0000 mg | Freq: Once | INTRAMUSCULAR | Status: AC
  Administered 2018-08-09: 125 mg via INTRAVENOUS
  Filled 2018-08-09: qty 2

## 2018-08-09 MED ORDER — SODIUM CHLORIDE 0.9 % IV SOLN
1.0000 g | Freq: Once | INTRAVENOUS | Status: AC
  Administered 2018-08-09: 1 g via INTRAVENOUS
  Filled 2018-08-09: qty 10

## 2018-08-09 MED ORDER — MORPHINE SULFATE (PF) 4 MG/ML IV SOLN
4.0000 mg | Freq: Once | INTRAVENOUS | Status: AC
  Administered 2018-08-09: 4 mg via INTRAVENOUS
  Filled 2018-08-09: qty 1

## 2018-08-09 MED ORDER — ALBUTEROL SULFATE HFA 108 (90 BASE) MCG/ACT IN AERS: 2.0000 | INHALATION_SPRAY | Freq: Four times a day (QID) | RESPIRATORY_TRACT | 2 refills | Status: AC | PRN

## 2018-08-09 MED ORDER — AZITHROMYCIN 250 MG PO TABS: ORAL_TABLET | ORAL | 0 refills | Status: DC

## 2018-08-09 MED ORDER — ONDANSETRON HCL 4 MG/2ML IJ SOLN
4.0000 mg | Freq: Once | INTRAMUSCULAR | Status: AC
  Administered 2018-08-09: 4 mg via INTRAVENOUS
  Filled 2018-08-09: qty 2

## 2018-08-09 MED ORDER — ALBUTEROL SULFATE HFA 108 (90 BASE) MCG/ACT IN AERS: 2.0000 | INHALATION_SPRAY | Freq: Four times a day (QID) | RESPIRATORY_TRACT | 2 refills | Status: DC | PRN

## 2018-08-09 MED ORDER — IPRATROPIUM-ALBUTEROL 0.5-2.5 (3) MG/3ML IN SOLN
3.0000 mL | Freq: Once | RESPIRATORY_TRACT | Status: AC
  Administered 2018-08-09: 3 mL via RESPIRATORY_TRACT
  Filled 2018-08-09: qty 3

## 2018-08-09 NOTE — ED Notes (Signed)
Pt states he started feeling lightheaded after breathing treatment at [phone]

## 2018-08-09 NOTE — ED Notes (Signed)
Sent blood to lab. 

## 2018-08-09 NOTE — ED Provider Notes (Signed)
[facility] Emergency Department Provider Note       Time seen: ----------------------------------------- 9:29 AM on 08/09/2018 -----------------------------------------   I have reviewed the triage vital signs and the nursing notes.  HISTORY   Chief Complaint Cough and Chest Pain    HPI Carl Guerrero is a 53 y.o. male with a history of GERD, anxiety, T12 fracture who presents to the ED for cough and wheezing.  Patient is unsure if he is had any fever, just finished steroids yesterday.  Patient states he was sick since prior to Christmas with a respiratory illness when he was seen here.  Patient was given cough medicine and steroids but he does not feel like they have helped very much.  He denies fevers or chills at this time.  Past Medical History:  Diagnosis Date  . Acid reflux   . Anxiety   . Fracture of T2 vertebra (HCC)     There are no active problems to display for this patient.   Past Surgical History:  Procedure Laterality Date  . BACK SURGERY    . HERNIA REPAIR    . SHOULDER ARTHROSCOPY      Allergies Ivp dye [iodinated diagnostic agents]; Versed [midazolam]; Phenergan [promethazine]; and Latex  Social History Social History   Tobacco Use  . Smoking status: Current Every Day Smoker    Packs/day: 1.00    Types: Cigarettes  . Smokeless tobacco: Never Used  Substance Use Topics  . Alcohol use: Yes    Comment: rarely  . Drug use: Yes    Types: Marijuana   Review of Systems Constitutional: Negative for fever. Cardiovascular: Negative for chest pain. Respiratory: Positive for shortness of breath and cough Gastrointestinal: Negative for abdominal pain, vomiting and diarrhea. Musculoskeletal: Negative for back pain. Skin: Negative for rash. Neurological: Negative for headaches, focal weakness or numbness.  All systems negative/normal/unremarkable except as stated in the  HPI  ____________________________________________   PHYSICAL EXAM:  VITAL SIGNS: ED Triage Vitals  Enc Vitals Group     BP 08/09/18 0924 (!) 156/136     Pulse Rate 08/09/18 0924 (!) 153     Resp 08/09/18 0924 (!) 22     Temp 08/09/18 0924 97.7 F (36.5 C)     Temp Source 08/09/18 0924 Oral     SpO2 08/09/18 0924 95 %     Weight 08/09/18 0925 217 lb 13 oz (98.8 kg)     Height 08/09/18 0925 5\' 10"  (1.778 m)     Head Circumference --      Peak Flow --      Pain Score 08/09/18 0925 9     Pain Loc --      Pain Edu? --      Excl. in GC? --    Constitutional: Alert and oriented.  Mild distress Eyes: Conjunctivae are normal. Normal extraocular movements. ENT      Head: Normocephalic and atraumatic.      Nose: No congestion/rhinnorhea.      Mouth/Throat: Mucous membranes are moist.      Neck: No stridor. Cardiovascular: Rapid rate, regular rhythm. No murmurs, rubs, or gallops. Respiratory: Mild tachypnea with wheezing bilaterally Gastrointestinal: Soft and nontender. Normal bowel sounds Musculoskeletal: Nontender with normal range of motion in extremities. No lower extremity tenderness nor edema. Neurologic:  Normal speech and language. No gross focal neurologic deficits are appreciated.  Skin:  Skin is warm, dry and intact. No rash noted. Psychiatric: Mood and affect are normal. Speech and behavior  are normal.  ____________________________________________  EKG: Interpreted by me.  Sinus tachycardia with a rate of 120 bpm, right axis deviation, normal QT, normal axis  ____________________________________________  ED COURSE:  As part of my medical decision making, I reviewed the following data within the electronic MEDICAL RECORD NUMBER History obtained from family if available, nursing notes, old chart and ekg, as well as notes from prior ED visits. Patient presented for respiratory symptoms that have persisted since recent evaluation, we will assess with labs and imaging as  indicated at this time.   Procedures ____________________________________________   LABS (pertinent positives/negatives)  Labs Reviewed  CBC WITH DIFFERENTIAL/PLATELET - Abnormal; Notable for the following components:      Result Value   WBC 18.0 (*)    MCH 34.4 (*)    Neutro Abs 13.6 (*)    Monocytes Absolute 1.2 (*)    Abs Immature Granulocytes 0.36 (*)    All other components within normal limits  BASIC METABOLIC PANEL - Abnormal; Notable for the following components:   Potassium 3.1 (*)    Creatinine, Ser 1.40 (*)    Calcium 8.5 (*)    GFR calc non Af Amer 58 (*)    All other components within normal limits  BRAIN NATRIURETIC PEPTIDE  TROPONIN I  INFLUENZA PANEL BY PCR (TYPE A & B)    RADIOLOGY Images were viewed by me  Chest x-ray IMPRESSION: Mild bronchitic changes. ____________________________________________   DIFFERENTIAL DIAGNOSIS   COPD, pneumonia, influenza, CHF, bronchitis  FINAL ASSESSMENT AND PLAN  Bronchitis   Plan: The patient had presented for assistant cough and shortness of breath. Patient's labs did reveal leukocytosis which are somewhat related to recent steroid intake. Patient's imaging was negative for pneumonia.  He currently has improved heart rate and normal oxygen saturations.  He will be discharged with albuterol and complete a Z-Pak for persistent cough.  I do not think additional steroids would help at this time.   Ulice Dash, MD    Note: This note was generated in part or whole with voice recognition software. Voice recognition is usually quite accurate but there are transcription errors that can and very often do occur. I apologize for any typographical errors that were not detected and corrected.     Emily Filbert, MD 08/09/18 1116

## 2018-08-09 NOTE — ED Triage Notes (Signed)
Says sick since prior to christmas wit resp illness. Cough and wheezing.  Unsure of fever.  Sounds wheezy.  Just finished steroids yesterday.

## 2018-10-10 ENCOUNTER — Ambulatory Visit: Payer: Self-pay | Admitting: Pharmacy Technician

## 2018-10-10 DIAGNOSIS — Z79899 Other long term (current) drug therapy: Secondary | ICD-10-CM

## 2018-10-10 NOTE — Progress Notes (Signed)
Called patient and explained how [facility]'s program works.  Verbally read [facility]'s contract to patient over-the-phone.  Patient verbally acknowledged that he understood and had no questions about [facility]'s contract.  Mailing to patient to sign.  Patient approved to receive medication assistance at [facility] as long as eligibility criteria continues to be met.  Referring patient to [facility] and for charitable care at [facility].  [facility]

## 2018-11-10 ENCOUNTER — Encounter: Payer: Self-pay | Admitting: Emergency Medicine

## 2018-11-10 ENCOUNTER — Emergency Department
Admission: EM | Admit: 2018-11-10 | Discharge: 2018-11-10 | Disposition: A | Discharge status: Home / Self Care | Payer: Self-pay | Attending: Emergency Medicine | Admitting: Emergency Medicine

## 2018-11-10 ENCOUNTER — Emergency Department: Payer: Self-pay

## 2018-11-10 ENCOUNTER — Other Ambulatory Visit: Payer: Self-pay

## 2018-11-10 DIAGNOSIS — R0789 Other chest pain: Secondary | ICD-10-CM | POA: Insufficient documentation

## 2018-11-10 DIAGNOSIS — Z91040 Latex allergy status: Secondary | ICD-10-CM | POA: Insufficient documentation

## 2018-11-10 DIAGNOSIS — Z79899 Other long term (current) drug therapy: Secondary | ICD-10-CM | POA: Insufficient documentation

## 2018-11-10 DIAGNOSIS — F1210 Cannabis abuse, uncomplicated: Secondary | ICD-10-CM | POA: Insufficient documentation

## 2018-11-10 DIAGNOSIS — R0602 Shortness of breath: Secondary | ICD-10-CM

## 2018-11-10 DIAGNOSIS — F17210 Nicotine dependence, cigarettes, uncomplicated: Secondary | ICD-10-CM | POA: Insufficient documentation

## 2018-11-10 LAB — BASIC METABOLIC PANEL
Anion gap: 5 (ref 5–15)
BUN: 14 mg/dL (ref 6–20)
CO2: 21 mmol/L — ABNORMAL LOW (ref 22–32)
Calcium: 9 mg/dL (ref 8.9–10.3)
Chloride: 109 mmol/L (ref 98–111)
Creatinine, Ser: 1.61 mg/dL — ABNORMAL HIGH (ref 0.61–1.24)
GFR calc Af Amer: 57 mL/min — ABNORMAL LOW (ref >60)
GFR calc non Af Amer: 49 mL/min — ABNORMAL LOW (ref >60)
Glucose, Bld: 108 mg/dL — ABNORMAL HIGH (ref 70–99)
Potassium: 3.9 mmol/L (ref 3.5–5.1)
Sodium: 135 mmol/L (ref 135–145)

## 2018-11-10 LAB — CBC
HCT: 45.9 % (ref 39.0–52.0)
Hemoglobin: 15.9 g/dL (ref 13.0–17.0)
MCH: 34.5 pg — ABNORMAL HIGH (ref 26.0–34.0)
MCHC: 34.6 g/dL (ref 30.0–36.0)
MCV: 99.6 fL (ref 80.0–100.0)
Platelets: 217 10*3/uL (ref 150–400)
RBC: 4.61 MIL/uL (ref 4.22–5.81)
RDW: 13.1 % (ref 11.5–15.5)
WBC: 9.3 10*3/uL (ref 4.0–10.5)
nRBC: 0 % (ref 0.0–0.2)

## 2018-11-10 LAB — TROPONIN I: Troponin I: <0.03 ng/mL (ref <0.03)

## 2018-11-10 MED ORDER — SODIUM CHLORIDE 0.9% FLUSH: 3.0000 mL | Freq: Once | INTRAVENOUS | Status: DC

## 2018-11-10 MED ORDER — MORPHINE SULFATE (PF) 4 MG/ML IV SOLN
4.0000 mg | Freq: Once | INTRAVENOUS | Status: AC
  Administered 2018-11-10: 4 mg via INTRAVENOUS
  Filled 2018-11-10: qty 1

## 2018-11-10 MED ORDER — OXYCODONE-ACETAMINOPHEN 5-325 MG PO TABS: 1.0000 | ORAL_TABLET | ORAL | 0 refills | Status: AC | PRN

## 2018-11-10 MED ORDER — CYCLOBENZAPRINE HCL 10 MG PO TABS
10.0000 mg | ORAL_TABLET | Freq: Once | ORAL | Status: AC
  Administered 2018-11-10: 10 mg via ORAL
  Filled 2018-11-10: qty 1

## 2018-11-10 MED ORDER — ONDANSETRON HCL 4 MG/2ML IJ SOLN
4.0000 mg | Freq: Once | INTRAMUSCULAR | Status: AC
  Administered 2018-11-10: 4 mg via INTRAVENOUS
  Filled 2018-11-10: qty 2

## 2018-11-10 NOTE — ED Provider Notes (Signed)
[facility] Emergency Department Provider Note  Time seen: 9:29 AM  I have reviewed the triage vital signs and the nursing notes.   HISTORY  Chief Complaint Chest Pain and Back Pain   HPI Carl Guerrero is a 53 y.o. male with a past medical history of anxiety, presents to the emergency department for left-sided chest discomfort.  According to the patient yesterday he sneezed and since that time has been having intermittent left chest spasms.  Patient states he will be lying still and all of a sudden he will have a spasm.  Patient had multiple while I was in the room evaluating him.  Patient states sudden onset of severe pain on the left side which feels like it is on top of the ribs.  Spasm the last seconds and then will relieve itself.  Denies any shortness of breath nausea or diaphoresis.  No fever.  No travel.  No cough or congestion.  Patient states he is experience chest wall spasms in the past although they have not been this severe.   Past Medical History:  Diagnosis Date  . Acid reflux   . Anxiety   . Fracture of T2 vertebra (HCC)     There are no active problems to display for this patient.   Past Surgical History:  Procedure Laterality Date  . BACK SURGERY    . HERNIA REPAIR    . SHOULDER ARTHROSCOPY      Prior to Admission medications   Medication Sig Start Date End Date Taking? Authorizing Provider  albuterol (PROVENTIL HFA;VENTOLIN HFA) 108 (90 Base) MCG/ACT inhaler Inhale 2 puffs into the lungs every 6 (six) hours as needed for wheezing or shortness of breath. 08/09/18   Emily Filbert, MD  amoxicillin-clavulanate (AUGMENTIN) 875-125 MG tablet Take 1 tablet by mouth 2 (two) times daily. 08/16/17   Cuthriell, Delorise Royals, PA-C  azithromycin (ZITHROMAX Z-PAK) 250 MG tablet Take 1 tablet by mouth daily 08/09/18   Emily Filbert, MD  chlorpheniramine-HYDROcodone [facility] PENNKINETIC ER) 10-8 MG/5ML SUER Take 5 mLs by mouth 2 (two)  times daily. 08/02/18   Joni Reining, PA-C  hyoscyamine (LEVSIN, ANASPAZ) 0.125 MG tablet Take 1 tablet (0.125 mg total) by mouth every 4 (four) hours as needed for cramping. 08/21/16   Schaevitz, Myra Rude, MD  ibuprofen (ADVIL,MOTRIN) 800 MG tablet Take 1 tablet (800 mg total) by mouth every 8 (eight) hours as needed. 11/02/15   Beers, Charmayne Sheer, PA-C  meloxicam (MOBIC) 15 MG tablet Take 1 tablet (15 mg total) by mouth daily. 08/16/17   Cuthriell, Delorise Royals, PA-C  methocarbamol (ROBAXIN) 500 MG tablet Take 1 tablet (500 mg total) by mouth every 6 (six) hours as needed for muscle spasms. 11/02/15   Beers, Charmayne Sheer, PA-C  methylPREDNISolone (MEDROL DOSEPAK) 4 MG TBPK tablet Take Tapered dose as directed 08/02/18   Joni Reining, PA-C  sucralfate (CARAFATE) 1 g tablet Take 1 tablet (1 g total) by mouth 4 (four) times daily. 08/21/16 08/21/17  Schaevitz, Myra Rude, MD  traMADol (ULTRAM) 50 MG tablet Take 1 tablet (50 mg total) by mouth every 6 (six) hours as needed. 08/16/17   Cuthriell, Delorise Royals, PA-C    Allergies  Allergen Reactions  . Ivp Dye [Iodinated Diagnostic Agents] Anaphylaxis  . Versed [Midazolam] Anaphylaxis  . Phenergan [Promethazine] Hives  . Latex Rash    No family history on file.  Social History Social History   Tobacco Use  . Smoking status: Current  Every Day Smoker    Packs/day: 1.00    Types: Cigarettes  . Smokeless tobacco: Never Used  Substance Use Topics  . Alcohol use: Yes    Comment: rarely  . Drug use: Yes    Types: Marijuana    Review of Systems Constitutional: Negative for fever. Cardiovascular: Left-sided chest pain, 10/10 in severity during a spasm, 2/10 once a spasm is gone. Respiratory: Negative for shortness of breath.  Negative for cough. Gastrointestinal: Negative for abdominal pain, vomiting  Musculoskeletal: Negative for musculoskeletal complaints Skin: Negative for skin complaints  Neurological: Negative for headache All other ROS  negative  ____________________________________________   PHYSICAL EXAM:  VITAL SIGNS: ED Triage Vitals  Enc Vitals Group     BP 11/10/18 0850 (!) 165/139     Pulse Rate 11/10/18 0850 (!) 133     Resp 11/10/18 0850 20     Temp 11/10/18 0850 97.8 F (36.6 C)     Temp Source 11/10/18 0850 Oral     SpO2 11/10/18 0850 98 %     Weight 11/10/18 0847 220 lb (99.8 kg)     Height 11/10/18 0847 5' 10.5" (1.791 m)     Head Circumference --      Peak Flow --      Pain Score 11/10/18 0847 7     Pain Loc --      Pain Edu? --      Excl. in GC? --    Constitutional: Alert and oriented. Well appearing and in no distress. Eyes: Normal exam ENT   Head: Normocephalic and atraumatic.   Mouth/Throat: Mucous membranes are moist. Cardiovascular: Normal rate, regular rhythm. No murmur Respiratory: Normal respiratory effort without tachypnea nor retractions. Breath sounds are clear  Gastrointestinal: Soft and nontender. No distention.  Musculoskeletal: Nontender with normal range of motion in all extremities. No lower extremity tenderness or edema. Neurologic:  Normal speech and language. No gross focal neurologic deficits  Skin:  Skin is warm, dry and intact.  Psychiatric: Mood and affect are normal.  ____________________________________________    EKG  EKG viewed and interpreted by myself shows sinus tachycardia 128 bpm with a narrow QRS, normal axis, normal intervals, nonspecific but no concerning ST changes.  ____________________________________________    RADIOLOGY  Chest x-ray negative  ____________________________________________   INITIAL IMPRESSION / ASSESSMENT AND PLAN / ED COURSE  Pertinent labs & imaging results that were available during my care of the patient were reviewed by me and considered in my medical decision making (see chart for details).  Patient presents to the emergency department for left-sided chest pain/spasm.  States the pain is occurring every  few minutes and lasting several seconds before spontaneously resolving.  States he has had similar spasm in the past but has not been this severe.  Denies any shortness of breath nausea or diaphoresis at any point.  Patient denies any fever cough congestion or recent travel.  Differential would include chest wall pain, spasm, ACS, pneumonia, pneumothorax.  We will check labs including cardiac enzymes.  EKG is reassuring.  Chest x-ray pending.  We will treat with morphine and Flexeril while awaiting results.  Patient's work-up is normal.  Troponin is negative.  Patient states the pain medicine has helped somewhat but he continues to have spasms intermittently every so often.  We will discharge with a short course of pain medication.  I discussed my normal chest pain return precautions.  Patient agreeable to plan of care.  Carl Guerrero was evaluated in  Emergency Department on 11/10/2018 for the symptoms described in the history of present illness. He was evaluated in the context of the global COVID-19 pandemic, which necessitated consideration that the patient might be at risk for infection with the SARS-CoV-2 virus that causes COVID-19. Institutional protocols and algorithms that pertain to the evaluation of patients at risk for COVID-19 are in a state of rapid change based on information released by regulatory bodies including the CDC and federal and state organizations. These policies and algorithms were followed during the patient's care in the ED.   ____________________________________________   FINAL CLINICAL IMPRESSION(S) / ED DIAGNOSES  Chest wall pain   Minna Antis, MD 11/10/18 1147

## 2018-11-10 NOTE — ED Notes (Signed)
ED Provider at bedside. 

## 2018-11-10 NOTE — ED Triage Notes (Signed)
States began back pain after sneezing yesterday, chest pain began today.

## 2020-08-17 ENCOUNTER — Telehealth: Payer: Self-pay | Admitting: Pharmacy Technician

## 2020-08-17 NOTE — Telephone Encounter (Signed)
Patient failed to provide 2021 proof of income.  No additional medication assistance will be provided by [facility] without the required proof of income documentation.  Patient notified by letter.  Carl Guerrero J. Carl Guerrero Care Manager [facility]   [address] [place], [place]  [address]     This is to inform you that you are no longer eligible to receive medication assistance at [facility].  The reason(s) are:    _____Your total gross monthly household income exceeds 250% of the Federal Poverty Level.   _____Tangible assets (savings, checking, stocks/bonds, pension, retirement, etc.) exceeds our limit  _____You are eligible to receive benefits from Medicaid, [facility] or HIV Medication              Assistance Program _____You are eligible to receive benefits from a Medicare Part "D" plan _____You have prescription insurance  _____You are not an [place] resident __X__Failure to provide all requested proof of income information for 2021.    Medication assistance will resume once all requested financial information has been returned to our clinic.  If you have questions, please contact our clinic at [phone].    Thank you,  [facility] 

## 2024-05-06 ENCOUNTER — Other Ambulatory Visit: Payer: Self-pay

## 2024-05-06 ENCOUNTER — Emergency Department

## 2024-05-06 ENCOUNTER — Emergency Department
Admission: EM | Admit: 2024-05-06 | Discharge: 2024-05-06 | Disposition: A | Discharge status: Home / Self Care | Attending: Emergency Medicine | Admitting: Emergency Medicine

## 2024-05-06 ENCOUNTER — Encounter: Payer: Self-pay | Admitting: Emergency Medicine

## 2024-05-06 DIAGNOSIS — L03115 Cellulitis of right lower limb: Secondary | ICD-10-CM | POA: Diagnosis not present

## 2024-05-06 DIAGNOSIS — G8929 Other chronic pain: Secondary | ICD-10-CM

## 2024-05-06 DIAGNOSIS — L03818 Cellulitis of other sites: Secondary | ICD-10-CM

## 2024-05-06 DIAGNOSIS — M5441 Lumbago with sciatica, right side: Secondary | ICD-10-CM | POA: Diagnosis not present

## 2024-05-06 DIAGNOSIS — R000 Tachycardia, unspecified: Secondary | ICD-10-CM | POA: Diagnosis not present

## 2024-05-06 DIAGNOSIS — M79671 Pain in right foot: Secondary | ICD-10-CM | POA: Diagnosis not present

## 2024-05-06 DIAGNOSIS — M545 Low back pain, unspecified: Secondary | ICD-10-CM | POA: Diagnosis present

## 2024-05-06 DIAGNOSIS — M5440 Lumbago with sciatica, unspecified side: Secondary | ICD-10-CM | POA: Diagnosis not present

## 2024-05-06 LAB — CBC
HCT: 41 % (ref 39.0–52.0)
Hemoglobin: 14.5 g/dL (ref 13.0–17.0)
MCH: 33.6 pg (ref 26.0–34.0)
MCHC: 35.4 g/dL (ref 30.0–36.0)
MCV: 95.1 fL (ref 80.0–100.0)
Platelets: 196 K/uL (ref 150–400)
RBC: 4.31 MIL/uL (ref 4.22–5.81)
RDW: 12.3 % (ref 11.5–15.5)
WBC: 13.7 K/uL — ABNORMAL HIGH (ref 4.0–10.5)
nRBC: 0 % (ref 0.0–0.2)

## 2024-05-06 LAB — COMPREHENSIVE METABOLIC PANEL WITH GFR
ALT: 10 U/L (ref 0–44)
AST: 13 U/L — ABNORMAL LOW (ref 15–41)
Albumin: 3.9 g/dL (ref 3.5–5.0)
Alkaline Phosphatase: 81 U/L (ref 38–126)
Anion gap: 11 (ref 5–15)
BUN: 9 mg/dL (ref 6–20)
CO2: 29 mmol/L (ref 22–32)
Calcium: 9.1 mg/dL (ref 8.9–10.3)
Chloride: 98 mmol/L (ref 98–111)
Creatinine, Ser: 1.2 mg/dL (ref 0.61–1.24)
GFR, Estimated: >60 mL/min (ref >60)
Glucose, Bld: 107 mg/dL — ABNORMAL HIGH (ref 70–99)
Potassium: 3.1 mmol/L — ABNORMAL LOW (ref 3.5–5.1)
Sodium: 138 mmol/L (ref 135–145)
Total Bilirubin: 0.9 mg/dL (ref 0.0–1.2)
Total Protein: 7.3 g/dL (ref 6.5–8.1)

## 2024-05-06 LAB — LACTIC ACID, PLASMA: Lactic Acid, Venous: 1.3 mmol/L (ref 0.5–1.9)

## 2024-05-06 MED ORDER — ONDANSETRON HCL 4 MG/2ML IJ SOLN
4.0000 mg | Freq: Once | INTRAMUSCULAR | Status: AC
Start: 2024-05-06 — End: 2024-05-06
  Administered 2024-05-06: 4 mg via INTRAVENOUS
  Filled 2024-05-06: qty 2

## 2024-05-06 MED ORDER — POTASSIUM CHLORIDE CRYS ER 20 MEQ PO TBCR
20.0000 meq | EXTENDED_RELEASE_TABLET | Freq: Once | ORAL | Status: AC
  Administered 2024-05-06: 20 meq via ORAL
  Filled 2024-05-06: qty 1

## 2024-05-06 MED ORDER — CYCLOBENZAPRINE HCL 10 MG PO TABS
10.0000 mg | ORAL_TABLET | Freq: Once | ORAL | Status: AC
  Administered 2024-05-06: 10 mg via ORAL
  Filled 2024-05-06: qty 1

## 2024-05-06 MED ORDER — KETOROLAC TROMETHAMINE 30 MG/ML IJ SOLN
30.0000 mg | Freq: Once | INTRAMUSCULAR | Status: AC
  Administered 2024-05-06: 30 mg via INTRAVENOUS
  Filled 2024-05-06: qty 1

## 2024-05-06 MED ORDER — SODIUM CHLORIDE 0.9 % IV BOLUS
1000.0000 mL | Freq: Once | INTRAVENOUS | Status: AC
  Administered 2024-05-06: 1000 mL via INTRAVENOUS

## 2024-05-06 MED ORDER — LIDOCAINE 5 % EX PTCH
1.0000 | MEDICATED_PATCH | CUTANEOUS | Status: DC
  Administered 2024-05-06: 1 via TRANSDERMAL
  Filled 2024-05-06: qty 1

## 2024-05-06 MED ORDER — TRAMADOL HCL 50 MG PO TABS
50.0000 mg | ORAL_TABLET | Freq: Once | ORAL | Status: AC
  Administered 2024-05-06: 50 mg via ORAL
  Filled 2024-05-06: qty 1

## 2024-05-06 MED ORDER — NAPROXEN 500 MG PO TBEC: 500.0000 mg | DELAYED_RELEASE_TABLET | Freq: Two times a day (BID) | ORAL | 0 refills | Status: AC

## 2024-05-06 MED ORDER — DOXYCYCLINE HYCLATE 100 MG PO TABS
100.0000 mg | ORAL_TABLET | Freq: Once | ORAL | Status: AC
  Administered 2024-05-06: 100 mg via ORAL
  Filled 2024-05-06: qty 1

## 2024-05-06 MED ORDER — DOXYCYCLINE HYCLATE 100 MG PO TABS: 100.0000 mg | ORAL_TABLET | Freq: Two times a day (BID) | ORAL | 0 refills | Status: AC

## 2024-05-06 MED ORDER — MORPHINE SULFATE (PF) 4 MG/ML IV SOLN
4.0000 mg | Freq: Once | INTRAVENOUS | Status: AC
  Administered 2024-05-06: 4 mg via INTRAVENOUS
  Filled 2024-05-06: qty 1

## 2024-05-06 NOTE — Discharge Instructions (Addendum)
 You have been diagnosed with chronic lumbar back pain, cellulitis of your right foot.  Please take doxycycline 1 tablet by mouth every 12 hours after main meals for 10 days.  Please take naproxen 1 tablet by mouth every 12 hours after meals.  Elevate your right foot.  Please come back to ED if you have new symptoms or symptoms worsen.  Please call [facility] clinic and make an appointment to establish care with a new PCP.  You can call Dr. Edie office and make an appointment for a follow-up of your chronic lumbar back pain.

## 2024-05-06 NOTE — ED Triage Notes (Signed)
 Patient to ED via POV for lower back pain that radiates down right leg. States he hurt back on Friday morning. Pain getting worse.

## 2024-05-06 NOTE — ED Notes (Signed)
 Bending to pick up Friday morning and felt lower back pop. Has had 3 back surgeries. Tool Flexeril  with no relief. Use warm and cold compresses took naproxen. More comfortable sitting in W/C.

## 2024-05-06 NOTE — ED Provider Notes (Signed)
 [facility] Provider Note    Event Date/Time   First MD Initiated Contact with Patient 05/06/24 1621     (approximate)   History   Back Pain    HPI  Carl Guerrero is a 58 y.o. male    with a past medical history of adjustment disorder, acute midline low back pain, chronic left shoulder pain, left elbow pain, gout, cellulitis, gout,, with no significant past medical history who presents to the ED complaining of back pain. According to the patient, symptoms started 24 hours right foot pain.  Patient endorses having history of chronic lumbar back pain that is getting worse.  Patient states this morning he was unable to walk due to swelling and pain of his right foot.  Patient denies history of trauma, gout, recent traveling.  Patient moved recently from [facility]  and needs to establish care with a PCP and orthopedics.     There are no active problems to display for this patient.    ROS: Patient currently denies any vision changes, tinnitus, difficulty speaking, facial droop, sore throat, chest pain, shortness of breath, abdominal pain, nausea/vomiting/diarrhea, dysuria, or weakness/numbness/paresthesias in any extremity   Physical Exam   Triage Vital Signs: ED Triage Vitals  Encounter Vitals Group     BP 05/06/24 1525 (!) 141/124     Girls Systolic BP Percentile --      Girls Diastolic BP Percentile --      Boys Systolic BP Percentile --      Boys Diastolic BP Percentile --      Pulse Rate 05/06/24 1525 (!) 145     Resp 05/06/24 1523 18     Temp 05/06/24 1523 98.6 F (37 C)     Temp Source 05/06/24 1523 Oral     SpO2 05/06/24 1523 97 %     Weight 05/06/24 1523 200 lb (90.7 kg)     Height 05/06/24 1523 5' 10 (1.778 m)     Head Circumference --      Peak Flow --      Pain Score 05/06/24 1523 10     Pain Loc --      Pain Education --      Exclude from Growth Chart --     Most recent vital signs: Vitals:   05/06/24 1745  05/06/24 1939  BP:  108/78  Pulse:  (!) 116  Resp:  18  Temp:    SpO2: 100% 96%     Physical Exam Vitals and nursing note reviewed.  During triage patient was hypertensive, tachycardic  Constitutional:      General: Awake and alert. No acute distress.    Appearance: Normal appearance. The patient is normal weight.      Able to speak in complete sentences without cough or dyspnea  HENT:     Head: Normocephalic and atraumatic.     Mouth: Mucous membranes are dry Eyes:     General: PERRL. Normal EOMs          Conjunctiva/sclera: Conjunctivae normal.  Nose No congestion/rhinorrhea  CV:                  Good peripheral perfusion.  Regular rate and rhythm  Resp:               Normal effort.  Equal breath sounds bilaterally.  No wheezing Abd:                 No distention.  Soft, nontender.  No rebound or guarding.  Musculoskeletal:        General: No swelling. Normal range of motion.  Lumbar spine: Skin: Evidence of old surgical scar.  Tender to palpation in the spinal process, tender to palpation and paraspinal muscle that radiates to the sacral area. Right foot: Dorsal base of the 2nd and 3rd metatarsal area, evidence of 2 punctuated lesions.  Skin is erythematosus, warm, tender to palpation.  Edema grade 1.  Pulses positive.  Sensation is intact. Skin:    General: Skin is warm and dry.     Capillary Refill: Capillary refill takes less than 2 seconds.     Findings: No rash.  Neurological:     Mental Status: The patient is awake and alert. MAE spontaneously. No gross focal neurologic deficits are appreciated.  Psychiatric Mood and affect are normal. Speech and behavior are normal.  ED Results / Procedures / Treatments   Labs (all labs ordered are listed, but only abnormal results are displayed) Labs Reviewed  CBC - Abnormal; Notable for the following components:      Result Value   WBC 13.7 (*)    All other components within normal limits  COMPREHENSIVE METABOLIC PANEL  WITH GFR - Abnormal; Notable for the following components:   Potassium 3.1 (*)    Glucose, Bld 107 (*)    AST 13 (*)    All other components within normal limits  LACTIC ACID, PLASMA     EKG See physician read: Sinusal tachycardia Vent. rate 144 BPM PR interval 142 ms QRS duration 70 ms QT/QTcB 334/517 ms P-R-T axes 79 3 74    RADIOLOGY I independently reviewed and interpreted imaging and agree with radiologists findings.      PROCEDURES:  Critical Care performed:   Procedures   MEDICATIONS ORDERED IN ED: Medications  lidocaine (LIDODERM) 5 % 1 patch (1 patch Transdermal Patch Applied 05/06/24 1731)  cyclobenzaprine  (FLEXERIL ) tablet 10 mg (10 mg Oral Given 05/06/24 1730)  traMADol  (ULTRAM ) tablet 50 mg (50 mg Oral Given 05/06/24 1730)  sodium chloride  0.9 % bolus 1,000 mL (0 mLs Intravenous Stopped 05/06/24 1856)  ketorolac  (TORADOL ) 30 MG/ML injection 30 mg (30 mg Intravenous Given 05/06/24 1801)  morphine  (PF) 4 MG/ML injection 4 mg (4 mg Intravenous Given 05/06/24 1936)  ondansetron  (ZOFRAN ) injection 4 mg (4 mg Intravenous Given 05/06/24 1938)  potassium chloride  SA (KLOR-CON  M) CR tablet 20 mEq (20 mEq Oral Given 05/06/24 1938)  doxycycline (VIBRA-TABS) tablet 100 mg (100 mg Oral Given 05/06/24 1938)   Clinical Course as of 05/06/24 2050  Mon May 06, 2024  1838 Lactic acid, plasma [AE]  1839 Comprehensive metabolic panel(!) Hypokalemia, 3.1 [AE]  1840 DG Foot Complete Right Negative [AE]  1858 Reassessed the patient, updated patient with results of x-ray and CMP.  Patient is uncomfortable due to pain.  I will order morphine , Zofran , doxycycline [AE]  2035 WBC(!): 13.7 White blood cells elevated at 13.7 [AE]  2042 Updated patient with results of CBC.  Explained to patient heart rate improved from 145-116.  Explained to patient that we can admit him to treat with IV antibiotics.  Also gave the option to be discharged take antibiotic by mouth and come back if he has  new symptoms or symptoms worsen.  Patient desired to be discharged, agreed to come back if he has new symptoms.  Patient will be discharged with doxycycline, naproxen, referral for [facility]  clinic to establish care.  And orthopedics for follow-up  of his chronic lumbar back pain [AE]    Clinical Course User Index [AE] Janit Kast, PA-C    IMPRESSION / MDM / ASSESSMENT AND PLAN / ED COURSE  I reviewed the triage vital signs and the nursing notes.  Differential diagnosis includes, but is not limited to, allergic reaction to insect bite, cellulitis, dehydration, chronic lumbar back pain  Patient's presentation is most consistent with acute, uncomplicated illness.   Carl Guerrero is a 59 y.o., male who presents today with history of 24 hours of right foot pain.  Physical exam evidence of 2 lesions for possible insect bite.  There are signs of infection on his right foot, erythema, tenderness, warmness.  Lumbar area tenderness to palpation in the spinal process and paraspinal muscles that radiates to the sacral area.  Reassessed the patient after getting pain medication he states pain is under control.  Blood work showed white blood cells elevated of 13. Patient desires to be discharged and take p.o. antibiotics. Patient's diagnosis is consistent with right foot cellulitis. I independently reviewed and interpreted imaging and agree with radiologists findings ruling out fracture, osteomyelitis. Labs are  reassuring. I did review the patient's allergies and medications.The patient is in stable and satisfactory condition for discharge home  Patient will be discharged home with prescriptions for doxycycline, naproxen. Patient is to follow up with PCP as needed or otherwise directed. Patient is given ED precautions to return to the ED for any worsening or new symptoms.  I will refer patient to [facility] clinic to establish care, I will refer patient to orthopedics for a follow-up of his chronic  lumbar pain. Discussed plan of care with patient, answered all of patient's questions, Patient agreeable to plan of care. Advised patient to take medications according to the instructions on the label. Discussed possible side effects of new medications. Patient verbalized understanding.  FINAL CLINICAL IMPRESSION(S) / ED DIAGNOSES   Final diagnoses:  Chronic low back pain with sciatica, sciatica laterality unspecified, unspecified back pain laterality  Cellulitis of other specified site     Rx / DC Orders   ED Discharge Orders          Ordered    doxycycline (VIBRA-TABS) 100 MG tablet  2 times daily        05/06/24 2047    naproxen (EC NAPROSYN) 500 MG EC tablet  2 times daily with meals        05/06/24 2047             Note:  This document was prepared using Dragon voice recognition software and may include unintentional dictation errors.   Janit Kast, PA-C 05/06/24 2050    Carl Charleston, MD 05/06/24 [id number]

## 2024-05-06 NOTE — ED Notes (Signed)
 Pt given urinal to void

## 2024-05-10 DIAGNOSIS — M79671 Pain in right foot: Secondary | ICD-10-CM | POA: Diagnosis not present

## 2024-05-10 DIAGNOSIS — M5416 Radiculopathy, lumbar region: Secondary | ICD-10-CM | POA: Diagnosis not present

## 2024-05-10 DIAGNOSIS — M545 Low back pain, unspecified: Secondary | ICD-10-CM | POA: Diagnosis not present

## 2024-05-16 DIAGNOSIS — M5416 Radiculopathy, lumbar region: Secondary | ICD-10-CM | POA: Diagnosis not present

## 2024-05-16 DIAGNOSIS — M545 Low back pain, unspecified: Secondary | ICD-10-CM | POA: Diagnosis not present
